# Patient Record
Sex: Female | Born: 1990 | Race: White | Hispanic: Yes | Marital: Single | Smoking: Never smoker
Health system: Southern US, Community
[De-identification: ages and names within clinical notes are randomized; demographics above are authoritative.]

## PROBLEM LIST (undated history)

## (undated) ENCOUNTER — Inpatient Hospital Stay (HOSPITAL_COMMUNITY): Payer: Self-pay

## (undated) DIAGNOSIS — D649 Anemia, unspecified: Secondary | ICD-10-CM

## (undated) DIAGNOSIS — Z789 Other specified health status: Secondary | ICD-10-CM

## (undated) DIAGNOSIS — K219 Gastro-esophageal reflux disease without esophagitis: Secondary | ICD-10-CM

## (undated) DIAGNOSIS — G43909 Migraine, unspecified, not intractable, without status migrainosus: Secondary | ICD-10-CM

## (undated) DIAGNOSIS — R519 Headache, unspecified: Secondary | ICD-10-CM

## (undated) DIAGNOSIS — R42 Dizziness and giddiness: Secondary | ICD-10-CM

## (undated) HISTORY — PX: NO PAST SURGERIES: SHX2092

## (undated) HISTORY — DX: Gastro-esophageal reflux disease without esophagitis: K21.9

## (undated) HISTORY — DX: Headache, unspecified: R51.9

## (undated) HISTORY — DX: Anemia, unspecified: D64.9

## (undated) HISTORY — DX: Migraine, unspecified, not intractable, without status migrainosus: G43.909

## (undated) HISTORY — DX: Dizziness and giddiness: R42

## (undated) HISTORY — DX: Other specified health status: Z78.9

---
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
## 2011-08-26 NOTE — L&D Delivery Note (Signed)
Delivery Note At 10:48 AM a viable female was delivered via  (Presentation: ROA).  APGAR: 8, 9; weight .   Placenta status: delivered intact spontaneously.  Cord: 3 vessels with the following complications: None.    Anesthesia:  none Episiotomy: none Lacerations: none Est. Blood Loss (mL): 300 mL  Mom to postpartum.  Baby to nursery-stable.  STINSON, JACOB JEHIEL 03/22/2012, 10:59 AM

## 2011-12-17 ENCOUNTER — Ambulatory Visit (INDEPENDENT_AMBULATORY_CARE_PROVIDER_SITE_OTHER): Payer: Self-pay | Admitting: Family Medicine

## 2011-12-17 VITALS — BP 111/62 | Temp 97.1°F | Ht 60.5 in | Wt 145.1 lb

## 2011-12-17 DIAGNOSIS — Z3400 Encounter for supervision of normal first pregnancy, unspecified trimester: Secondary | ICD-10-CM

## 2011-12-17 DIAGNOSIS — O0930 Supervision of pregnancy with insufficient antenatal care, unspecified trimester: Secondary | ICD-10-CM | POA: Insufficient documentation

## 2011-12-17 LAB — POCT URINALYSIS DIP (DEVICE)
Glucose, UA: NEGATIVE mg/dL
Nitrite: NEGATIVE
Urobilinogen, UA: 0.2 mg/dL (ref 0.0–1.0)

## 2011-12-17 NOTE — Patient Instructions (Addendum)
It was great meeting you today! We are going to check your prenatal labs today as well as schedule you for an ultrasound.   For the belly tightening, it is most likely due to what are called: "Deberah Pelton". If you start feeling stronger contractions that don't go away with rest, if they happen more frequently or more intensely, if you can't talk through them, come to the Maternal Admissions Unit (MAU) here at [facility]. If you feel decreased baby movement, any bleeding from below or loss of fluid, please come to the MAU.   Pregnancy - Second Trimester The second trimester of pregnancy (3 to 6 months) is a period of rapid growth for you and your baby. At the end of the sixth month, your baby is about 9 inches long and weighs 1 1/2 pounds. You will begin to feel the baby move between 18 and 20 weeks of the pregnancy. This is called quickening. Weight gain is faster. A clear fluid (colostrum) may leak out of your breasts. You may feel small contractions of the womb (uterus). This is known as false labor or Braxton-Hicks contractions. This is like a practice for labor when the baby is ready to be born. Usually, the problems with morning sickness have usually passed by the end of your first trimester. Some women develop small dark blotches (called cholasma, mask of pregnancy) on their face that usually goes away after the baby is born. Exposure to the sun makes the blotches worse. Acne may also develop in some pregnant women and pregnant women who have acne, may find that it goes away. PRENATAL EXAMS  Blood work may continue to be done during prenatal exams. These tests are done to check on your health and the probable health of your baby. Blood work is used to follow your blood levels (hemoglobin). Anemia (low hemoglobin) is common during pregnancy. Iron and vitamins are given to help prevent this. You will also be checked for diabetes between 24 and 28 weeks of the pregnancy. Some of the previous  blood tests may be repeated.   The size of the uterus is measured during each visit. This is to make sure that the baby is continuing to grow properly according to the dates of the pregnancy.   Your blood pressure is checked every prenatal visit. This is to make sure you are not getting toxemia.   Your urine is checked to make sure you do not have an infection, diabetes or protein in the urine.   Your weight is checked often to make sure gains are happening at the suggested rate. This is to ensure that both you and your baby are growing normally.   Sometimes, an ultrasound is performed to confirm the proper growth and development of the baby. This is a test which bounces harmless sound waves off the baby so your caregiver can more accurately determine due dates.  Sometimes, a specialized test is done on the amniotic fluid surrounding the baby. This test is called an amniocentesis. The amniotic fluid is obtained by sticking a needle into the belly (abdomen). This is done to check the chromosomes in instances where there is a concern about possible genetic problems with the baby. It is also sometimes done near the end of pregnancy if an early delivery is required. In this case, it is done to help make sure the baby's lungs are mature enough for the baby to live outside of the womb. CHANGES OCCURING IN THE SECOND TRIMESTER OF PREGNANCY Your  body goes through many changes during pregnancy. They vary from person to person. Talk to your caregiver about changes you notice that you are concerned about.  During the second trimester, you will likely have an increase in your appetite. It is normal to have cravings for certain foods. This varies from person to person and pregnancy to pregnancy.   Your lower abdomen will begin to bulge.   You may have to urinate more often because the uterus and baby are pressing on your bladder. It is also common to get more bladder infections during pregnancy (pain with  urination). You can help this by drinking lots of fluids and emptying your bladder before and after intercourse.   You may begin to get stretch marks on your hips, abdomen, and breasts. These are normal changes in the body during pregnancy. There are no exercises or medications to take that prevent this change.   You may begin to develop swollen and bulging veins (varicose veins) in your legs. Wearing support hose, elevating your feet for 15 minutes, 3 to 4 times a day and limiting salt in your diet helps lessen the problem.   Heartburn may develop as the uterus grows and pushes up against the stomach. Antacids recommended by your caregiver helps with this problem. Also, eating smaller meals 4 to 5 times a day helps.   Constipation can be treated with a stool softener or adding bulk to your diet. Drinking lots of fluids, vegetables, fruits, and whole grains are helpful.   Exercising is also helpful. If you have been very active up until your pregnancy, most of these activities can be continued during your pregnancy. If you have been less active, it is helpful to start an exercise program such as walking.   Hemorrhoids (varicose veins in the rectum) may develop at the end of the second trimester. Warm sitz baths and hemorrhoid cream recommended by your caregiver helps hemorrhoid problems.   Backaches may develop during this time of your pregnancy. Avoid heavy lifting, wear low heal shoes and practice good posture to help with backache problems.   Some pregnant women develop tingling and numbness of their hand and fingers because of swelling and tightening of ligaments in the wrist (carpel tunnel syndrome). This goes away after the baby is born.   As your breasts enlarge, you may have to get a bigger bra. Get a comfortable, cotton, support bra. Do not get a nursing bra until the last month of the pregnancy if you will be nursing the baby.   You may get a dark line from your belly button to the  pubic area called the linea nigra.   You may develop rosy cheeks because of increase blood flow to the face.   You may develop spider looking lines of the face, neck, arms and chest. These go away after the baby is born.  HOME CARE INSTRUCTIONS   It is extremely important to avoid all smoking, herbs, alcohol, and unprescribed drugs during your pregnancy. These chemicals affect the formation and growth of the baby. Avoid these chemicals throughout the pregnancy to ensure the delivery of a healthy infant.   Most of your home care instructions are the same as suggested for the first trimester of your pregnancy. Keep your caregiver's appointments. Follow your caregiver's instructions regarding medication use, exercise and diet.   During pregnancy, you are providing food for you and your baby. Continue to eat regular, well-balanced meals. Choose foods such as meat, fish, milk and other  low fat dairy products, vegetables, fruits, and whole-grain breads and cereals. Your caregiver will tell you of the ideal weight gain.   A physical sexual relationship may be continued up until near the end of pregnancy if there are no other problems. Problems could include early (premature) leaking of amniotic fluid from the membranes, vaginal bleeding, abdominal pain, or other medical or pregnancy problems.   Exercise regularly if there are no restrictions. Check with your caregiver if you are unsure of the safety of some of your exercises. The greatest weight gain will occur in the last 2 trimesters of pregnancy. Exercise will help you:   Control your weight.   Get you in shape for labor and delivery.   Lose weight after you have the baby.   Wear a good support or jogging bra for breast tenderness during pregnancy. This may help if worn during sleep. Pads or tissues may be used in the bra if you are leaking colostrum.   Do not use hot tubs, steam rooms or saunas throughout the pregnancy.   Wear your seat belt  at all times when driving. This protects you and your baby if you are in an accident.   Avoid raw meat, uncooked cheese, cat litter boxes and soil used by cats. These carry germs that can cause birth defects in the baby.   The second trimester is also a good time to visit your dentist for your dental health if this has not been done yet. Getting your teeth cleaned is OK. Use a soft toothbrush. Brush gently during pregnancy.   It is easier to loose urine during pregnancy. Tightening up and strengthening the pelvic muscles will help with this problem. Practice stopping your urination while you are going to the bathroom. These are the same muscles you need to strengthen. It is also the muscles you would use as if you were trying to stop from passing gas. You can practice tightening these muscles up 10 times a set and repeating this about 3 times per day. Once you know what muscles to tighten up, do not perform these exercises during urination. It is more likely to contribute to an infection by backing up the urine.   Ask for help if you have financial, counseling or nutritional needs during pregnancy. Your caregiver will be able to offer counseling for these needs as well as refer you for other special needs.   Your skin may become oily. If so, wash your face with mild soap, use non-greasy moisturizer and oil or cream based makeup.  MEDICATIONS AND DRUG USE IN PREGNANCY  Take prenatal vitamins as directed. The vitamin should contain 1 milligram of folic acid. Keep all vitamins out of reach of children. Only a couple vitamins or tablets containing iron may be fatal to a baby or young child when ingested.   Avoid use of all medications, including herbs, over-the-counter medications, not prescribed or suggested by your caregiver. Only take over-the-counter or prescription medicines for pain, discomfort, or fever as directed by your caregiver. Do not use aspirin.   Let your caregiver also know about herbs  you may be using.   Alcohol is related to a number of birth defects. This includes fetal alcohol syndrome. All alcohol, in any form, should be avoided completely. Smoking will cause low birth rate and premature babies.   Street or illegal drugs are very harmful to the baby. They are absolutely forbidden. A baby born to an addicted mother will be addicted at birth. The  baby will go through the same withdrawal an adult does.  SEEK MEDICAL CARE IF:  You have any concerns or worries during your pregnancy. It is better to call with your questions if you feel they cannot wait, rather than worry about them. SEEK IMMEDIATE MEDICAL CARE IF:   An unexplained oral temperature above 102 F (38.9 C) develops, or as your caregiver suggests.   You have leaking of fluid from the vagina (birth canal). If leaking membranes are suspected, take your temperature and tell your caregiver of this when you call.   There is vaginal spotting, bleeding, or passing clots. Tell your caregiver of the amount and how many pads are used. Light spotting in pregnancy is common, especially following intercourse.   You develop a bad smelling vaginal discharge with a change in the color from clear to white.   You continue to feel sick to your stomach (nauseated) and have no relief from remedies suggested. You vomit blood or coffee ground-like materials.   You lose more than 2 pounds of weight or gain more than 2 pounds of weight over 1 week, or as suggested by your caregiver.   You notice swelling of your face, hands, feet, or legs.   You get exposed to [place] measles and have never had them.   You are exposed to fifth disease or chickenpox.   You develop belly (abdominal) pain. Round ligament discomfort is a common non-cancerous (benign) cause of abdominal pain in pregnancy. Your caregiver still must evaluate you.   You develop a bad headache that does not go away.   You develop fever, diarrhea, pain with urination, or  shortness of breath.   You develop visual problems, blurry, or double vision.   You fall or are in a car accident or any kind of trauma.   There is mental or physical violence at home.  Document Released: 08/05/2001 Document Revised: 07/31/2011 Document Reviewed: 02/07/2009 [facility] Patient Information 2012 [place], [place].  Fetal Movement Counts Patient Name: __________________________________________________ Patient Due Date: ____________________ Melody Haver counts is highly recommended in high risk pregnancies, but it is a good idea for every pregnant woman to do. Start counting fetal movements at 28 weeks of the pregnancy. Fetal movements increase after eating a full meal or eating or drinking something sweet (the blood sugar is higher). It is also important to drink plenty of fluids (well hydrated) before doing the count. Lie on your left side because it helps with the circulation or you can sit in a comfortable chair with your arms over your belly (abdomen) with no distractions around you. DOING THE COUNT  Try to do the count the same time of day each time you do it.   Mark the day and time, then see how long it takes for you to feel 10 movements (kicks, flutters, swishes, rolls). You should have at least 10 movements within 2 hours. You will most likely feel 10 movements in much less than 2 hours. If you do not, wait an hour and count again. After a couple of days you will see a pattern.   What you are looking for is a change in the pattern or not enough counts in 2 hours. Is it taking longer in time to reach 10 movements?  SEEK MEDICAL CARE IF:  You feel less than 10 counts in 2 hours. Tried twice.   No movement in one hour.   The pattern is changing or taking longer each day to reach 10 counts in 2 hours.  You feel the baby is not moving as it usually does.  Document Released: 09/10/2006 Document Revised: 07/31/2011 Document Reviewed: 03/13/2009 [facility] Patient Information 2012  [place], [place].

## 2011-12-17 NOTE — Progress Notes (Signed)
Discussed patient with resident.  Chart review done.  Agree with resident note.

## 2011-12-17 NOTE — Progress Notes (Signed)
Subjective:    Cristina Burke is being seen today for her first obstetrical visit. She is at [redacted]w[redacted]d gestation. Her obstetrical history is significant for late prenatal care. Relationship with FOB: significant other, living together. Patient does not intend to breast feed. Patient feeling short of breath with sitting, better with standing and walking. No chest pain. No history of asthma. No cough. No lower extremity swelling. This has lasted for 2 months now.  She also has been feeling her belly tighten in the morning and afternoon. It doesn't last very long and doesn't occur with increased frequency. She is able to talk through it and rest helps. She denies any loss of fluid, discharge, vaginal bleeding and is feeling the baby move.   Pregnancy history fully reviewed.  Menstrual History: OB History    Grav Para Term Preterm Abortions TAB SAB Ect Mult Living   2 1 1       1       Patient's last menstrual period was 06/21/2011.    Review of Systems Pertinent items are noted in HPI.    Objective:    BP 111/62  Temp 97.1 F (36.2 C)  Ht 5' 0.5" (1.537 m)  Wt 145 lb 1.6 oz (65.817 kg)  BMI 27.87 kg/m2  LMP 06/21/2011 General appearance: alert, cooperative and appears stated age Throat: lips, mucosa, and tongue normal; teeth and gums normal Lungs: clear to auscultation bilaterally and no wheezing Heart: regular rate and rhythm, S1, S2 normal, no murmur, click, rub or gallop Abdomen: gravid Pelvic exam: normal external genitalia, vulva, vagina and cervix. Minimal discharge.  Cervical exam: closed, thick and long Extremities: extremities normal, atraumatic, no cyanosis or edema    Assessment:   21 yo G2P1001 at 25.4wga who presents for her initial OB visit  Plan:     - Initial labs drawn. - anatomy US scheduled - 1hr glucola at next visit - Braxton Hicks: belly tightening she is feeling most likely due to [employer]. Gave patient information about it and went over preterm  labor red flags - Shortness of breath: most likely from gravid uterus putting pressure on lungs in sitting position. Went over red flags: chest pain, fever, increased difficulty breathing, significant, asymmetric lower extremity swelling.  - plans on bottle feeding, doesn't know what mode of birth control she is going to use yet. Has used depo in the past with some side effects.  - gave list of pediatricians in town as she wanted more information about that.  - continue Prenatal vitamins. - Genetic Testing: too late to obtain - follow up in 4 weeks.

## 2011-12-17 NOTE — Progress Notes (Signed)
P=111 , patient here to start prenatal care today. Has had positive pregnancy test at [facility]. C/o trace edema in feet, c/o pelvic pain sometimes, Discussed appropriate weight gain, given pregnancy booklet.C/o sometimes when sitting at home or in care feels short of breath and like heart is racing at times. Oxygen saturation today 98%. -99%. Patient declines flu shot.

## 2011-12-17 NOTE — Progress Notes (Signed)
Ob US scheduled on 12/19/11 @ 1030

## 2011-12-18 LAB — GC/CHLAMYDIA PROBE AMP, GENITAL: Chlamydia, DNA Probe: NEGATIVE

## 2011-12-18 LAB — OBSTETRIC PANEL
Basophils Absolute: 0 10*3/uL (ref 0.0–0.1)
Eosinophils Absolute: 0.1 10*3/uL (ref 0.0–0.7)
Hepatitis B Surface Ag: NEGATIVE
Lymphocytes Relative: 22 % (ref 12–46)
Lymphs Abs: 1.6 10*3/uL (ref 0.7–4.0)
MCH: 28.6 pg (ref 26.0–34.0)
Neutrophils Relative %: 67 % (ref 43–77)
Platelets: 222 10*3/uL (ref 150–400)
RBC: 3.78 MIL/uL — ABNORMAL LOW (ref 3.87–5.11)
RDW: 13.7 % (ref 11.5–15.5)
WBC: 7.4 10*3/uL (ref 4.0–10.5)

## 2011-12-19 ENCOUNTER — Encounter: Payer: Self-pay | Admitting: *Deleted

## 2011-12-19 ENCOUNTER — Ambulatory Visit (HOSPITAL_COMMUNITY)
Admission: RE | Admit: 2011-12-19 | Discharge: 2011-12-19 | Disposition: A | Discharge status: Home / Self Care | Payer: Medicaid Other | Source: Ambulatory Visit | Attending: Obstetrics and Gynecology | Admitting: Obstetrics and Gynecology

## 2011-12-19 DIAGNOSIS — Z3400 Encounter for supervision of normal first pregnancy, unspecified trimester: Secondary | ICD-10-CM

## 2011-12-19 DIAGNOSIS — Z1389 Encounter for screening for other disorder: Secondary | ICD-10-CM | POA: Insufficient documentation

## 2011-12-19 DIAGNOSIS — O358XX0 Maternal care for other (suspected) fetal abnormality and damage, not applicable or unspecified: Secondary | ICD-10-CM | POA: Insufficient documentation

## 2011-12-19 DIAGNOSIS — Z363 Encounter for antenatal screening for malformations: Secondary | ICD-10-CM | POA: Insufficient documentation

## 2012-01-02 ENCOUNTER — Encounter (HOSPITAL_COMMUNITY): Payer: Self-pay | Admitting: *Deleted

## 2012-01-02 ENCOUNTER — Inpatient Hospital Stay (HOSPITAL_COMMUNITY)
Admission: AD | Admit: 2012-01-02 | Discharge: 2012-01-02 | Disposition: A | Discharge status: Home / Self Care | Payer: Medicaid Other | Source: Ambulatory Visit | Attending: Obstetrics and Gynecology | Admitting: Obstetrics and Gynecology

## 2012-01-02 DIAGNOSIS — O368190 Decreased fetal movements, unspecified trimester, not applicable or unspecified: Secondary | ICD-10-CM

## 2012-01-02 LAB — URINALYSIS, ROUTINE W REFLEX MICROSCOPIC
Bilirubin Urine: NEGATIVE
Ketones, ur: NEGATIVE mg/dL
Nitrite: NEGATIVE
Protein, ur: NEGATIVE mg/dL
Urobilinogen, UA: 0.2 mg/dL (ref 0.0–1.0)

## 2012-01-02 NOTE — Discharge Instructions (Signed)
Fetal Movement Counts Patient Name: __________________________________________________ Patient Due Date: ____________________ Kick counts is highly recommended in high risk pregnancies, but it is a good idea for every pregnant woman to do. Start counting fetal movements at 28 weeks of the pregnancy. Fetal movements increase after eating a full meal or eating or drinking something sweet (the blood sugar is higher). It is also important to drink plenty of fluids (well hydrated) before doing the count. Lie on your left side because it helps with the circulation or you can sit in a comfortable chair with your arms over your belly (abdomen) with no distractions around you. DOING THE COUNT  Try to do the count the same time of day each time you do it.   Mark the day and time, then see how long it takes for you to feel 10 movements (kicks, flutters, swishes, rolls). You should have at least 10 movements within 2 hours. You will most likely feel 10 movements in much less than 2 hours. If you do not, wait an hour and count again. After a couple of days you will see a pattern.   What you are looking for is a change in the pattern or not enough counts in 2 hours. Is it taking longer in time to reach 10 movements?  SEEK MEDICAL CARE IF:  You feel less than 10 counts in 2 hours. Tried twice.   No movement in one hour.   The pattern is changing or taking longer each day to reach 10 counts in 2 hours.   You feel the baby is not moving as it usually does.  Date: ____________ Movements: ____________ Start time: ____________ Finish time: ____________  Date: ____________ Movements: ____________ Start time: ____________ Finish time: ____________ Date: ____________ Movements: ____________ Start time: ____________ Finish time: ____________ Date: ____________ Movements: ____________ Start time: ____________ Finish time: ____________ Date: ____________ Movements: ____________ Start time: ____________ Finish time:  ____________ Date: ____________ Movements: ____________ Start time: ____________ Finish time: ____________ Date: ____________ Movements: ____________ Start time: ____________ Finish time: ____________ Date: ____________ Movements: ____________ Start time: ____________ Finish time: ____________  Date: ____________ Movements: ____________ Start time: ____________ Finish time: ____________ Date: ____________ Movements: ____________ Start time: ____________ Finish time: ____________ Date: ____________ Movements: ____________ Start time: ____________ Finish time: ____________ Date: ____________ Movements: ____________ Start time: ____________ Finish time: ____________ Date: ____________ Movements: ____________ Start time: ____________ Finish time: ____________ Date: ____________ Movements: ____________ Start time: ____________ Finish time: ____________ Date: ____________ Movements: ____________ Start time: ____________ Finish time: ____________  Date: ____________ Movements: ____________ Start time: ____________ Finish time: ____________ Date: ____________ Movements: ____________ Start time: ____________ Finish time: ____________ Date: ____________ Movements: ____________ Start time: ____________ Finish time: ____________ Date: ____________ Movements: ____________ Start time: ____________ Finish time: ____________ Date: ____________ Movements: ____________ Start time: ____________ Finish time: ____________ Date: ____________ Movements: ____________ Start time: ____________ Finish time: ____________ Date: ____________ Movements: ____________ Start time: ____________ Finish time: ____________  Date: ____________ Movements: ____________ Start time: ____________ Finish time: ____________ Date: ____________ Movements: ____________ Start time: ____________ Finish time: ____________ Date: ____________ Movements: ____________ Start time: ____________ Finish time: ____________ Date: ____________ Movements:  ____________ Start time: ____________ Finish time: ____________ Date: ____________ Movements: ____________ Start time: ____________ Finish time: ____________ Date: ____________ Movements: ____________ Start time: ____________ Finish time: ____________ Date: ____________ Movements: ____________ Start time: ____________ Finish time: ____________  Date: ____________ Movements: ____________ Start time: ____________ Finish time: ____________ Date: ____________ Movements: ____________ Start time: ____________ Finish time: ____________ Date: ____________ Movements: ____________ Start time:   ____________ Finish time: ____________ Date: ____________ Movements: ____________ Start time: ____________ Finish time: ____________ Date: ____________ Movements: ____________ Start time: ____________ Finish time: ____________ Date: ____________ Movements: ____________ Start time: ____________ Finish time: ____________ Date: ____________ Movements: ____________ Start time: ____________ Finish time: ____________  Date: ____________ Movements: ____________ Start time: ____________ Finish time: ____________ Date: ____________ Movements: ____________ Start time: ____________ Finish time: ____________ Date: ____________ Movements: ____________ Start time: ____________ Finish time: ____________ Date: ____________ Movements: ____________ Start time: ____________ Finish time: ____________ Date: ____________ Movements: ____________ Start time: ____________ Finish time: ____________ Date: ____________ Movements: ____________ Start time: ____________ Finish time: ____________ Date: ____________ Movements: ____________ Start time: ____________ Finish time: ____________  Date: ____________ Movements: ____________ Start time: ____________ Finish time: ____________ Date: ____________ Movements: ____________ Start time: ____________ Finish time: ____________ Date: ____________ Movements: ____________ Start time: ____________ Finish  time: ____________ Date: ____________ Movements: ____________ Start time: ____________ Finish time: ____________ Date: ____________ Movements: ____________ Start time: ____________ Finish time: ____________ Date: ____________ Movements: ____________ Start time: ____________ Finish time: ____________ Date: ____________ Movements: ____________ Start time: ____________ Finish time: ____________  Date: ____________ Movements: ____________ Start time: ____________ Finish time: ____________ Date: ____________ Movements: ____________ Start time: ____________ Finish time: ____________ Date: ____________ Movements: ____________ Start time: ____________ Finish time: ____________ Date: ____________ Movements: ____________ Start time: ____________ Finish time: ____________ Date: ____________ Movements: ____________ Start time: ____________ Finish time: ____________ Date: ____________ Movements: ____________ Start time: ____________ Finish time: ____________ Document Released: 09/10/2006 Document Revised: 07/31/2011 Document Reviewed: 03/13/2009 [employer] Patient Information 2012 [employer]. 

## 2012-01-02 NOTE — MAU Note (Signed)
Pt states she hasn't felt the baby move since 2300 last night.  Placing pt on monitor, baby movements heard.  Pt given Kick count button to track movements.

## 2012-01-02 NOTE — MAU Provider Note (Signed)
Chief Complaint:  Decreased Fetal Movement    First Provider Initiated Contact with Patient 01/02/12 1220      Cristina Burke is  21 y.o. G2P1001.  Patient's last menstrual period was 06/21/2011..  She presents complaining of Decreased Fetal Movement . Onset is described as ongoing and has been present for  12 hours.   Obstetrical/Gynecological History: OB History    Grav Para Term Preterm Abortions TAB SAB Ect Mult Living   2 1 1       1       Past Medical History: Past Medical History  Diagnosis Date  . No pertinent past medical history     Past Surgical History: Past Surgical History  Procedure Date  . No past surgeries     Family History: Family History  Problem Relation Age of Onset  . Hypertension Mother   . Diabetes Brother     Social History: History  Substance Use Topics  . Smoking status: Never Smoker   . Smokeless tobacco: Never Used  . Alcohol Use: No    Allergies: No Known Allergies  Prescriptions prior to admission  Medication Sig Dispense Refill  . PRENATAL VITAMINS PO Take 1 tablet by mouth daily.        Review of Systems - Negative except what has been reviewed in HPI History obtained from the patient Respiratory ROS: no cough, shortness of breath, or wheezing Cardiovascular ROS: no chest pain or dyspnea on exertion Gastrointestinal ROS: no abdominal pain, change in bowel habits, or black or bloody stools  Physical Exam   Blood pressure 116/63, pulse 102, temperature 98 F (36.7 C), temperature source Oral, resp. rate 18, height 5' 0.25" (1.53 m), weight 145 lb 3.2 oz (65.862 kg), last menstrual period 06/21/2011, SpO2 100.00%, unknown if currently breastfeeding.  General: General appearance - alert, well appearing, and in no distress, oriented to person, place, and time and overweight Abdomen: Gravid, non tender, fetal movement palpated with exam FHR: 145, mod variability, + accels, Cat I tracing Toco: no  contractions  Labs: Recent Results (from the past 24 hour(s))  URINALYSIS, ROUTINE W REFLEX MICROSCOPIC   Collection Time   01/02/12 11:50 AM      Component Value Range   Color, Urine YELLOW  YELLOW    APPearance CLEAR  CLEAR    Specific Gravity, Urine 1.020  1.005 - 1.030    pH 7.5  5.0 - 8.0    Glucose, UA NEGATIVE  NEGATIVE (mg/dL)   Hgb urine dipstick NEGATIVE  NEGATIVE    Bilirubin Urine NEGATIVE  NEGATIVE    Ketones, ur NEGATIVE  NEGATIVE (mg/dL)   Protein, ur NEGATIVE  NEGATIVE (mg/dL)   Urobilinogen, UA 0.2  0.0 - 1.0 (mg/dL)   Nitrite NEGATIVE  NEGATIVE    Leukocytes, UA NEGATIVE  NEGATIVE    ED Course: Pt reports good fetal movement since being in MAU  Assessment: 1. Decreased fetal movement in pregnancy      Plan: Discharge home Kick Counts reviewed   Krishawna Stiefel E. 01/02/2012,12:27 PM

## 2012-01-05 NOTE — MAU Provider Note (Signed)
Agree with above note.  Cristina Burke 01/05/2012 11:56 AM   

## 2012-01-14 ENCOUNTER — Ambulatory Visit (INDEPENDENT_AMBULATORY_CARE_PROVIDER_SITE_OTHER): Payer: Medicaid Other | Admitting: Advanced Practice Midwife

## 2012-01-14 VITALS — BP 101/70 | Temp 97.5°F | Wt 146.6 lb

## 2012-01-14 DIAGNOSIS — Z3490 Encounter for supervision of normal pregnancy, unspecified, unspecified trimester: Secondary | ICD-10-CM

## 2012-01-14 DIAGNOSIS — O0930 Supervision of pregnancy with insufficient antenatal care, unspecified trimester: Secondary | ICD-10-CM

## 2012-01-14 DIAGNOSIS — O2610 Low weight gain in pregnancy, unspecified trimester: Secondary | ICD-10-CM | POA: Insufficient documentation

## 2012-01-14 DIAGNOSIS — O9989 Other specified diseases and conditions complicating pregnancy, childbirth and the puerperium: Secondary | ICD-10-CM

## 2012-01-14 LAB — POCT URINALYSIS DIP (DEVICE)
Ketones, ur: NEGATIVE mg/dL
Leukocytes, UA: NEGATIVE
Nitrite: NEGATIVE
Protein, ur: NEGATIVE mg/dL
pH: 6.5 (ref 5.0–8.0)

## 2012-01-14 NOTE — Progress Notes (Signed)
I was present for the exam and agree with above.  

## 2012-01-14 NOTE — Progress Notes (Signed)
P=104,  c/o pelvic pressure same as has been having, c/o irregular contractions intermittent , c/o having a cold with head congestion and cough. Discussed appropriate weight gain.

## 2012-01-14 NOTE — Patient Instructions (Signed)

## 2012-01-14 NOTE — Progress Notes (Signed)
Cristina Burke is a 21 y.o. G2P1001 @ [redacted]w[redacted]d here for 4 week f/u appointment.  Reviewed ultrasound results from last visit.  Patient reports about 5 episodes of mild contractions each day.  Continues to experience some pelvic pressure but unchanged.    Discussed appropriate weight gain.  Will obtain growth [place] PRN.  Recent MAU visit 2/2 concern about decreased fetal movement.   1o GTT done today.

## 2012-01-15 ENCOUNTER — Telehealth: Payer: Self-pay | Admitting: *Deleted

## 2012-01-15 DIAGNOSIS — O0930 Supervision of pregnancy with insufficient antenatal care, unspecified trimester: Secondary | ICD-10-CM

## 2012-01-15 NOTE — Telephone Encounter (Signed)
Called [facility] and discussed her 1 hr GTT results may not be accurate , need to repeat . Appointment given.

## 2012-01-20 ENCOUNTER — Other Ambulatory Visit: Payer: Medicaid Other

## 2012-01-20 DIAGNOSIS — Z3490 Encounter for supervision of normal pregnancy, unspecified, unspecified trimester: Secondary | ICD-10-CM

## 2012-01-20 DIAGNOSIS — O2610 Low weight gain in pregnancy, unspecified trimester: Secondary | ICD-10-CM

## 2012-01-21 ENCOUNTER — Encounter: Payer: Self-pay | Admitting: Obstetrics and Gynecology

## 2012-01-21 ENCOUNTER — Telehealth: Payer: Self-pay | Admitting: *Deleted

## 2012-01-21 DIAGNOSIS — Z3490 Encounter for supervision of normal pregnancy, unspecified, unspecified trimester: Secondary | ICD-10-CM

## 2012-01-21 NOTE — Telephone Encounter (Signed)
Pt notified of abnormal 1 hour gtt, she will come in on Friday 01/23/12 for a 3 hour gtt, pt informed to be npo the night before her visit.

## 2012-01-21 NOTE — Telephone Encounter (Signed)
Message copied by Mannie Stabile on Wed Jan 21, 2012  8:54 AM ------      Message from: CONSTANT, PEGGY      Created: Wed Jan 21, 2012  7:42 AM       Please schedule 3 hour GTT for this patient            Cristina Burke

## 2012-01-23 ENCOUNTER — Other Ambulatory Visit: Payer: Medicaid Other

## 2012-01-23 DIAGNOSIS — R7309 Other abnormal glucose: Secondary | ICD-10-CM

## 2012-01-24 LAB — GLUCOSE TOLERANCE, 3 HOURS: Glucose Tolerance, 1 hour: 180 mg/dL (ref 70–189)

## 2012-01-28 ENCOUNTER — Encounter: Payer: Self-pay | Admitting: Family Medicine

## 2012-01-28 ENCOUNTER — Ambulatory Visit (INDEPENDENT_AMBULATORY_CARE_PROVIDER_SITE_OTHER): Payer: Medicaid Other | Admitting: Family Medicine

## 2012-01-28 VITALS — BP 107/65 | Temp 97.3°F | Wt 147.1 lb

## 2012-01-28 DIAGNOSIS — O9989 Other specified diseases and conditions complicating pregnancy, childbirth and the puerperium: Secondary | ICD-10-CM

## 2012-01-28 DIAGNOSIS — Z3490 Encounter for supervision of normal pregnancy, unspecified, unspecified trimester: Secondary | ICD-10-CM

## 2012-01-28 LAB — POCT URINALYSIS DIP (DEVICE)
Bilirubin Urine: NEGATIVE
Glucose, UA: NEGATIVE mg/dL
Hgb urine dipstick: NEGATIVE
Nitrite: NEGATIVE

## 2012-01-28 NOTE — Patient Instructions (Signed)
Breastfeeding BENEFITS OF BREASTFEEDING For the baby  The first milk (colostrum) helps the baby's digestive system function better.   There are antibodies from the mother in the milk that help the baby fight off infections.   The baby has a lower incidence of asthma, allergies, and SIDS (sudden infant death syndrome).   The nutrients in breast milk are better than formulas for the baby and helps the baby's brain grow better.   Babies who breastfeed have less gas, colic, and constipation.  For the mother  Breastfeeding helps develop a very special bond between mother and baby.   It is more convenient, always available at the correct temperature and cheaper than formula feeding.   It burns calories in the mother and helps with losing weight that was gained during pregnancy.   It makes the uterus contract back down to normal size faster and slows bleeding following delivery.   Breastfeeding mothers have a lower risk of developing breast cancer.  NURSE FREQUENTLY  A healthy, full-term baby may breastfeed as often as every hour or space his or her feedings to every 3 hours.   How often to nurse will vary from baby to baby. Watch your baby for signs of hunger, not the clock.   Nurse as often as the baby requests, or when you feel the need to reduce the fullness of your breasts.   Awaken the baby if it has been 3 to 4 hours since the last feeding.   Frequent feeding will help the mother make more milk and will prevent problems like sore nipples and engorgement of the breasts.  BABY'S POSITION AT THE BREAST  Whether lying down or sitting, be sure that the baby's tummy is facing your tummy.   Support the breast with 4 fingers underneath the breast and the thumb above. Make sure your fingers are well away from the nipple and baby's mouth.   Stroke the baby's lips and cheek closest to the breast gently with your finger or nipple.   When the baby's mouth is open wide enough, place all  of your nipple and as much of the dark area around the nipple as possible into your baby's mouth.   Pull the baby in close so the tip of the nose and the baby's cheeks touch the breast during the feeding.  FEEDINGS  The length of each feeding varies from baby to baby and from feeding to feeding.   The baby must suck about 2 to 3 minutes for your milk to get to him or her. This is called a "let down." For this reason, allow the baby to feed on each breast as long as he or she wants. Your baby will end the feeding when he or she has received the right balance of nutrients.   To break the suction, put your finger into the corner of the baby's mouth and slide it between his or her gums before removing your breast from his or her mouth. This will help prevent sore nipples.  REDUCING BREAST ENGORGEMENT  In the first week after your baby is born, you may experience signs of breast engorgement. When breasts are engorged, they feel heavy, warm, full, and may be tender to the touch. You can reduce engorgement if you:   Nurse frequently, every 2 to 3 hours. Mothers who breastfeed early and often have fewer problems with engorgement.   Place light ice packs on your breasts between feedings. This reduces swelling. Wrap the ice packs in a   lightweight towel to protect your skin.   Apply moist hot packs to your breast for 5 to 10 minutes before each feeding. This increases circulation and helps the milk flow.   Gently massage your breast before and during the feeding.   Make sure that the baby empties at least one breast at every feeding before switching sides.   Use a breast pump to empty the breasts if your baby is sleepy or not nursing well. You may also want to pump if you are returning to work or or you feel you are getting engorged.   Avoid bottle feeds, pacifiers or supplemental feedings of water or juice in place of breastfeeding.   Be sure the baby is latched on and positioned properly while  breastfeeding.   Prevent fatigue, stress, and anemia.   Wear a supportive bra, avoiding underwire styles.   Eat a balanced diet with enough fluids.  If you follow these suggestions, your engorgement should improve in 24 to 48 hours. If you are still experiencing difficulty, call your lactation consultant or caregiver. IS MY BABY GETTING ENOUGH MILK? Sometimes, mothers worry about whether their babies are getting enough milk. You can be assured that your baby is getting enough milk if:  The baby is actively sucking and you hear swallowing.   The baby nurses at least 8 to 12 times in a 24 hour time period. Nurse your baby until he or she unlatches or falls asleep at the first breast (at least 10 to 20 minutes), then offer the second side.   The baby is wetting 5 to 6 disposable diapers (6 to 8 cloth diapers) in a 24 hour period by 5 to 6 days of age.   The baby is having at least 2 to 3 stools every 24 hours for the first few months. Breast milk is all the food your baby needs. It is not necessary for your baby to have water or formula. In fact, to help your breasts make more milk, it is best not to give your baby supplemental feedings during the early weeks.   The stool should be soft and yellow.   The baby should gain 4 to 7 ounces per week after he is 4 days old.  TAKE CARE OF YOURSELF Take care of your breasts by:  Bathing or showering daily.   Avoiding the use of soaps on your nipples.   Start feedings on your left breast at one feeding and on your right breast at the next feeding.   You will notice an increase in your milk supply 2 to 5 days after delivery. You may feel some discomfort from engorgement, which makes your breasts very firm and often tender. Engorgement "peaks" out within 24 to 48 hours. In the meantime, apply warm moist towels to your breasts for 5 to 10 minutes before feeding. Gentle massage and expression of some milk before feeding will soften your breasts, making  it easier for your baby to latch on. Wear a well fitting nursing bra and air dry your nipples for 10 to 15 minutes after each feeding.   Only use cotton bra pads.   Only use pure lanolin on your nipples after nursing. You do not need to wash it off before nursing.  Take care of yourself by:   Eating well-balanced meals and nutritious snacks.   Drinking milk, fruit juice, and water to satisfy your thirst (about 8 glasses a day).   Getting plenty of rest.   Increasing calcium in   your diet (1200 mg a day).   Avoiding foods that you notice affect the baby in a bad way.  SEEK MEDICAL CARE IF:   You have any questions or difficulty with breastfeeding.   You need help.   You have a hard, red, sore area on your breast, accompanied by a fever of 100.5 F (38.1 C) or more.   Your baby is too sleepy to eat well or is having trouble sleeping.   Your baby is wetting less than 6 diapers per day, by 5 days of age.   Your baby's skin or white part of his or her eyes is more yellow than it was in the hospital.   You feel depressed.  Document Released: 08/11/2005 Document Revised: 07/31/2011 Document Reviewed: 03/26/2009 [employer] Patient Information 2012 [employer]. 

## 2012-01-28 NOTE — Progress Notes (Signed)
Pulse-101 Pressure- lower abd

## 2012-01-28 NOTE — Progress Notes (Signed)
3hr GTT normal.  No other concerns.  Good fetal activity.  F/u 2 weeks.

## 2012-02-11 ENCOUNTER — Ambulatory Visit (INDEPENDENT_AMBULATORY_CARE_PROVIDER_SITE_OTHER): Payer: Medicaid Other | Admitting: Family Medicine

## 2012-02-11 VITALS — BP 105/70 | Temp 97.1°F | Wt 149.5 lb

## 2012-02-11 DIAGNOSIS — O9989 Other specified diseases and conditions complicating pregnancy, childbirth and the puerperium: Secondary | ICD-10-CM

## 2012-02-11 DIAGNOSIS — O2610 Low weight gain in pregnancy, unspecified trimester: Secondary | ICD-10-CM

## 2012-02-11 LAB — POCT URINALYSIS DIP (DEVICE)
Bilirubin Urine: NEGATIVE
Ketones, ur: NEGATIVE mg/dL
pH: 6.5 (ref 5.0–8.0)

## 2012-02-11 NOTE — Progress Notes (Signed)
21 y.o. G2P1001 @ 33w 4d per LMP who has no concerns. Counseled on breast feeding and pre-term contractions. Plans to BF. Will f/u in 2 weeks.

## 2012-02-11 NOTE — Progress Notes (Signed)
Pulse- 92  Edema- feet  Pain/pressure- "cramping"

## 2012-02-25 ENCOUNTER — Ambulatory Visit (INDEPENDENT_AMBULATORY_CARE_PROVIDER_SITE_OTHER): Payer: Medicaid Other | Admitting: Physician Assistant

## 2012-02-25 VITALS — BP 103/72 | Temp 98.2°F | Wt 149.3 lb

## 2012-02-25 DIAGNOSIS — O9989 Other specified diseases and conditions complicating pregnancy, childbirth and the puerperium: Secondary | ICD-10-CM

## 2012-02-25 DIAGNOSIS — Z3490 Encounter for supervision of normal pregnancy, unspecified, unspecified trimester: Secondary | ICD-10-CM

## 2012-02-25 LAB — POCT URINALYSIS DIP (DEVICE)
Bilirubin Urine: NEGATIVE
Glucose, UA: NEGATIVE mg/dL
Ketones, ur: NEGATIVE mg/dL
Protein, ur: NEGATIVE mg/dL
Specific Gravity, Urine: 1.025 (ref 1.005–1.030)

## 2012-02-25 NOTE — Progress Notes (Signed)
No complaints. +FM daily. GBS/Cultures at next visit.

## 2012-02-25 NOTE — Progress Notes (Signed)
Pulse 95 Patient reports lower pelvic pain/pressure & occasional contractions

## 2012-02-25 NOTE — Patient Instructions (Signed)
Pregnancy - Third Trimester The third trimester of pregnancy (the last 3 months) is a period of the most rapid growth for you and your baby. The baby approaches a length of 20 inches and a weight of 6 to 10 pounds. The baby is adding on fat and getting ready for life outside your body. While inside, babies have periods of sleeping and waking, suck their thumbs, and hiccups. You can often feel small contractions of the uterus. This is false labor. It is also called Braxton-Hicks contractions. This is like a practice for labor. The usual problems in this stage of pregnancy include more difficulty breathing, swelling of the hands and feet from water retention, and having to urinate more often because of the uterus and baby pressing on your bladder.  PRENATAL EXAMS  Blood work may continue to be done during prenatal exams. These tests are done to check on your health and the probable health of your baby. Blood work is used to follow your blood levels (hemoglobin). Anemia (low hemoglobin) is common during pregnancy. Iron and vitamins are given to help prevent this. You may also continue to be checked for diabetes. Some of the past blood tests may be done again.   The size of the uterus is measured during each visit. This makes sure your baby is growing properly according to your pregnancy dates.   Your blood pressure is checked every prenatal visit. This is to make sure you are not getting toxemia.   Your urine is checked every prenatal visit for infection, diabetes and protein.   Your weight is checked at each visit. This is done to make sure gains are happening at the suggested rate and that you and your baby are growing normally.   Sometimes, an ultrasound is performed to confirm the position and the proper growth and development of the baby. This is a test done that bounces harmless sound waves off the baby so your caregiver can more accurately determine due dates.   Discuss the type of pain  medication and anesthesia you will have during your labor and delivery.   Discuss the possibility and anesthesia if a Cesarean Section might be necessary.   Inform your caregiver if there is any mental or physical violence at home.  Sometimes, a specialized non-stress test, contraction stress test and biophysical profile are done to make sure the baby is not having a problem. Checking the amniotic fluid surrounding the baby is called an amniocentesis. The amniotic fluid is removed by sticking a needle into the belly (abdomen). This is sometimes done near the end of pregnancy if an early delivery is required. In this case, it is done to help make sure the baby's lungs are mature enough for the baby to live outside of the womb. If the lungs are not mature and it is unsafe to deliver the baby, an injection of cortisone medication is given to the mother 1 to 2 days before the delivery. This helps the baby's lungs mature and makes it safer to deliver the baby. CHANGES OCCURING IN THE THIRD TRIMESTER OF PREGNANCY Your body goes through many changes during pregnancy. They vary from person to person. Talk to your caregiver about changes you notice and are concerned about.  During the last trimester, you have probably had an increase in your appetite. It is normal to have cravings for certain foods. This varies from person to person and pregnancy to pregnancy.   You may begin to get stretch marks on your hips,   abdomen, and breasts. These are normal changes in the body during pregnancy. There are no exercises or medications to take which prevent this change.   Constipation may be treated with a stool softener or adding bulk to your diet. Drinking lots of fluids, fiber in vegetables, fruits, and whole grains are helpful.   Exercising is also helpful. If you have been very active up until your pregnancy, most of these activities can be continued during your pregnancy. If you have been less active, it is helpful  to start an exercise program such as walking. Consult your caregiver before starting exercise programs.   Avoid all smoking, alcohol, un-prescribed drugs, herbs and "street drugs" during your pregnancy. These chemicals affect the formation and growth of the baby. Avoid chemicals throughout the pregnancy to ensure the delivery of a healthy infant.   Backache, varicose veins and hemorrhoids may develop or get worse.   You will tire more easily in the third trimester, which is normal.   The baby's movements may be stronger and more often.   You may become short of breath easily.   Your belly button may stick out.   A yellow discharge may leak from your breasts called colostrum.   You may have a bloody mucus discharge. This usually occurs a few days to a week before labor begins.  HOME CARE INSTRUCTIONS   Keep your caregiver's appointments. Follow your caregiver's instructions regarding medication use, exercise, and diet.   During pregnancy, you are providing food for you and your baby. Continue to eat regular, well-balanced meals. Choose foods such as meat, fish, milk and other low fat dairy products, vegetables, fruits, and whole-grain breads and cereals. Your caregiver will tell you of the ideal weight gain.   A physical sexual relationship may be continued throughout pregnancy if there are no other problems such as early (premature) leaking of amniotic fluid from the membranes, vaginal bleeding, or belly (abdominal) pain.   Exercise regularly if there are no restrictions. Check with your caregiver if you are unsure of the safety of your exercises. Greater weight gain will occur in the last 2 trimesters of pregnancy. Exercising helps:   Control your weight.   Get you in shape for labor and delivery.   You lose weight after you deliver.   Rest a lot with legs elevated, or as needed for leg cramps or low back pain.   Wear a good support or jogging bra for breast tenderness during  pregnancy. This may help if worn during sleep. Pads or tissues may be used in the bra if you are leaking colostrum.   Do not use hot tubs, steam rooms, or saunas.   Wear your seat belt when driving. This protects you and your baby if you are in an accident.   Avoid raw meat, cat litter boxes and soil used by cats. These carry germs that can cause birth defects in the baby.   It is easier to loose urine during pregnancy. Tightening up and strengthening the pelvic muscles will help with this problem. You can practice stopping your urination while you are going to the bathroom. These are the same muscles you need to strengthen. It is also the muscles you would use if you were trying to stop from passing gas. You can practice tightening these muscles up 10 times a set and repeating this about 3 times per day. Once you know what muscles to tighten up, do not perform these exercises during urination. It is more likely   to cause an infection by backing up the urine.   Ask for help if you have financial, counseling or nutritional needs during pregnancy. Your caregiver will be able to offer counseling for these needs as well as refer you for other special needs.   Make a list of emergency phone numbers and have them available.   Plan on getting help from family or friends when you go home from the hospital.   Make a trial run to the hospital.   Take prenatal classes with the father to understand, practice and ask questions about the labor and delivery.   Prepare the baby's room/nursery.   Do not travel out of the city unless it is absolutely necessary and with the advice of your caregiver.   Wear only low or no heal shoes to have better balance and prevent falling.  MEDICATIONS AND DRUG USE IN PREGNANCY  Take prenatal vitamins as directed. The vitamin should contain 1 milligram of folic acid. Keep all vitamins out of reach of children. Only a couple vitamins or tablets containing iron may be fatal  to a baby or young child when ingested.   Avoid use of all medications, including herbs, over-the-counter medications, not prescribed or suggested by your caregiver. Only take over-the-counter or prescription medicines for pain, discomfort, or fever as directed by your caregiver. Do not use aspirin, ibuprofen (Motrin, Advil, Nuprin) or naproxen (Aleve) unless OK'd by your caregiver.   Let your caregiver also know about herbs you may be using.   Alcohol is related to a number of birth defects. This includes fetal alcohol syndrome. All alcohol, in any form, should be avoided completely. Smoking will cause low birth rate and premature babies.   Street/illegal drugs are very harmful to the baby. They are absolutely forbidden. A baby born to an addicted mother will be addicted at birth. The baby will go through the same withdrawal an adult does.  SEEK MEDICAL CARE IF: You have any concerns or worries during your pregnancy. It is better to call with your questions if you feel they cannot wait, rather than worry about them. DECISIONS ABOUT CIRCUMCISION You may or may not know the sex of your baby. If you know your baby is a boy, it may be time to think about circumcision. Circumcision is the removal of the foreskin of the penis. This is the skin that covers the sensitive end of the penis. There is no proven medical need for this. Often this decision is made on what is popular at the time or based upon religious beliefs and social issues. You can discuss these issues with your caregiver or pediatrician. SEEK IMMEDIATE MEDICAL CARE IF:   An unexplained oral temperature above 102 F (38.9 C) develops, or as your caregiver suggests.   You have leaking of fluid from the vagina (birth canal). If leaking membranes are suspected, take your temperature and tell your caregiver of this when you call.   There is vaginal spotting, bleeding or passing clots. Tell your caregiver of the amount and how many pads are  used.   You develop a bad smelling vaginal discharge with a change in the color from clear to white.   You develop vomiting that lasts more than 24 hours.   You develop chills or fever.   You develop shortness of breath.   You develop burning on urination.   You loose more than 2 pounds of weight or gain more than 2 pounds of weight or as suggested by your   caregiver.   You notice sudden swelling of your face, hands, and feet or legs.   You develop belly (abdominal) pain. Round ligament discomfort is a common non-cancerous (benign) cause of abdominal pain in pregnancy. Your caregiver still must evaluate you.   You develop a severe headache that does not go away.   You develop visual problems, blurred or double vision.   If you have not felt your baby move for more than 1 hour. If you think the baby is not moving as much as usual, eat something with sugar in it and lie down on your left side for an hour. The baby should move at least 4 to 5 times per hour. Call right away if your baby moves less than that.   You fall, are in a car accident or any kind of trauma.   There is mental or physical violence at home.  Document Released: 08/05/2001 Document Revised: 07/31/2011 Document Reviewed: 02/07/2009 [employer] Patient Information 2012 [employer]. 

## 2012-03-03 ENCOUNTER — Ambulatory Visit (INDEPENDENT_AMBULATORY_CARE_PROVIDER_SITE_OTHER): Payer: Medicaid Other | Admitting: Family

## 2012-03-03 VITALS — BP 112/76 | Temp 98.1°F | Wt 151.1 lb

## 2012-03-03 DIAGNOSIS — Z3490 Encounter for supervision of normal pregnancy, unspecified, unspecified trimester: Secondary | ICD-10-CM

## 2012-03-03 DIAGNOSIS — O9989 Other specified diseases and conditions complicating pregnancy, childbirth and the puerperium: Secondary | ICD-10-CM

## 2012-03-03 DIAGNOSIS — O2610 Low weight gain in pregnancy, unspecified trimester: Secondary | ICD-10-CM

## 2012-03-03 LAB — POCT URINALYSIS DIP (DEVICE)
Bilirubin Urine: NEGATIVE
Glucose, UA: NEGATIVE mg/dL
Hgb urine dipstick: NEGATIVE
Ketones, ur: NEGATIVE mg/dL
Specific Gravity, Urine: 1.02 (ref 1.005–1.030)

## 2012-03-03 NOTE — Progress Notes (Signed)
Discussed round ligament pain; no bleeding or LOF; GBS and GC/CT collected

## 2012-03-03 NOTE — Addendum Note (Signed)
Addended by: Melissa Noon on: 03/03/2012 10:46 AM   Modules accepted: Orders

## 2012-03-03 NOTE — Progress Notes (Signed)
Pulse 70 Patient reports occasional contractions and lower abdominal pain/pressure

## 2012-03-04 LAB — GC/CHLAMYDIA PROBE AMP, GENITAL: GC Probe Amp, Genital: NEGATIVE

## 2012-03-05 LAB — OB RESULTS CONSOLE GBS: GBS: POSITIVE

## 2012-03-09 LAB — CULTURE, BETA STREP (GROUP B ONLY)

## 2012-03-10 ENCOUNTER — Ambulatory Visit (INDEPENDENT_AMBULATORY_CARE_PROVIDER_SITE_OTHER): Payer: Medicaid Other | Admitting: Advanced Practice Midwife

## 2012-03-10 VITALS — BP 119/74 | Temp 97.0°F | Wt 150.5 lb

## 2012-03-10 DIAGNOSIS — Z3490 Encounter for supervision of normal pregnancy, unspecified, unspecified trimester: Secondary | ICD-10-CM

## 2012-03-10 DIAGNOSIS — O2610 Low weight gain in pregnancy, unspecified trimester: Secondary | ICD-10-CM

## 2012-03-10 DIAGNOSIS — O9989 Other specified diseases and conditions complicating pregnancy, childbirth and the puerperium: Secondary | ICD-10-CM

## 2012-03-10 LAB — POCT URINALYSIS DIP (DEVICE)
Bilirubin Urine: NEGATIVE
Glucose, UA: NEGATIVE mg/dL
Nitrite: NEGATIVE
Urobilinogen, UA: 0.2 mg/dL (ref 0.0–1.0)

## 2012-03-10 NOTE — Progress Notes (Signed)
Pt reports doing well, no questions or concerns; reviewed GBS results and treatment; No bleeding or LOF.  PTL precautions given.

## 2012-03-10 NOTE — Progress Notes (Signed)
Pulse- 71  Pain/pressure-lower abd  Vaginal discharge- clear Pt c/o "snot discharge that was pink tinged" x 1

## 2012-03-17 ENCOUNTER — Ambulatory Visit (INDEPENDENT_AMBULATORY_CARE_PROVIDER_SITE_OTHER): Payer: Medicaid Other | Admitting: Physician Assistant

## 2012-03-17 VITALS — BP 103/73 | Temp 97.2°F | Wt 152.6 lb

## 2012-03-17 DIAGNOSIS — O9989 Other specified diseases and conditions complicating pregnancy, childbirth and the puerperium: Secondary | ICD-10-CM

## 2012-03-17 DIAGNOSIS — Z3490 Encounter for supervision of normal pregnancy, unspecified, unspecified trimester: Secondary | ICD-10-CM

## 2012-03-17 DIAGNOSIS — O0930 Supervision of pregnancy with insufficient antenatal care, unspecified trimester: Secondary | ICD-10-CM

## 2012-03-17 LAB — POCT URINALYSIS DIP (DEVICE)
Bilirubin Urine: NEGATIVE
Ketones, ur: NEGATIVE mg/dL
Leukocytes, UA: NEGATIVE
Nitrite: NEGATIVE
Protein, ur: NEGATIVE mg/dL

## 2012-03-17 NOTE — Patient Instructions (Signed)

## 2012-03-17 NOTE — Progress Notes (Signed)
No complaints. +FM. EFW 7#8 by [facility]. Desired epidural in labor. Breastfeeding. Nexplanon pp

## 2012-03-17 NOTE — Progress Notes (Signed)
P =90 C/o pain and pressure in the lower pelvic area

## 2012-03-22 ENCOUNTER — Inpatient Hospital Stay (HOSPITAL_COMMUNITY)
Admission: AD | Admit: 2012-03-22 | Discharge: 2012-03-24 | DRG: 775 | Disposition: A | Discharge status: Home / Self Care | Payer: Medicaid Other | Source: Ambulatory Visit | Attending: Family Medicine | Admitting: Family Medicine

## 2012-03-22 ENCOUNTER — Encounter (HOSPITAL_COMMUNITY): Payer: Self-pay | Admitting: *Deleted

## 2012-03-22 DIAGNOSIS — IMO0001 Reserved for inherently not codable concepts without codable children: Secondary | ICD-10-CM

## 2012-03-22 DIAGNOSIS — Z22330 Carrier of Group B streptococcus: Secondary | ICD-10-CM

## 2012-03-22 DIAGNOSIS — O99892 Other specified diseases and conditions complicating childbirth: Principal | ICD-10-CM | POA: Diagnosis present

## 2012-03-22 DIAGNOSIS — O9989 Other specified diseases and conditions complicating pregnancy, childbirth and the puerperium: Secondary | ICD-10-CM

## 2012-03-22 DIAGNOSIS — Z3490 Encounter for supervision of normal pregnancy, unspecified, unspecified trimester: Secondary | ICD-10-CM

## 2012-03-22 LAB — RPR: RPR Ser Ql: NONREACTIVE

## 2012-03-22 LAB — CBC
HCT: 38.8 % (ref 36.0–46.0)
Hemoglobin: 12.6 g/dL (ref 12.0–15.0)
MCH: 27.5 pg (ref 26.0–34.0)
MCHC: 32.5 g/dL (ref 30.0–36.0)
MCV: 84.7 fL (ref 78.0–100.0)
RBC: 4.58 MIL/uL (ref 3.87–5.11)

## 2012-03-22 LAB — TYPE AND SCREEN

## 2012-03-22 LAB — ABO/RH: ABO/RH(D): O POS

## 2012-03-22 MED ORDER — OXYCODONE-ACETAMINOPHEN 5-325 MG PO TABS
1.0000 | ORAL_TABLET | ORAL | Status: DC | PRN
  Administered 2012-03-24: 1 via ORAL
  Filled 2012-03-22: qty 1

## 2012-03-22 MED ORDER — ACETAMINOPHEN 325 MG PO TABS: 650.0000 mg | ORAL_TABLET | ORAL | Status: DC | PRN

## 2012-03-22 MED ORDER — ONDANSETRON HCL 4 MG/2ML IJ SOLN: 4.0000 mg | Freq: Four times a day (QID) | INTRAMUSCULAR | Status: DC | PRN

## 2012-03-22 MED ORDER — SENNOSIDES-DOCUSATE SODIUM 8.6-50 MG PO TABS
2.0000 | ORAL_TABLET | Freq: Every day | ORAL | Status: DC
  Administered 2012-03-22 – 2012-03-23 (×2): 2 via ORAL

## 2012-03-22 MED ORDER — LACTATED RINGERS IV SOLN
INTRAVENOUS | Status: DC
  Administered 2012-03-22: 10:00:00 via INTRAVENOUS

## 2012-03-22 MED ORDER — FLEET ENEMA 7-19 GM/118ML RE ENEM: 1.0000 | ENEMA | RECTAL | Status: DC | PRN

## 2012-03-22 MED ORDER — EPHEDRINE 5 MG/ML INJ: 10.0000 mg | INTRAVENOUS | Status: DC | PRN

## 2012-03-22 MED ORDER — IBUPROFEN 600 MG PO TABS
600.0000 mg | ORAL_TABLET | Freq: Four times a day (QID) | ORAL | Status: DC
  Administered 2012-03-22 – 2012-03-24 (×7): 600 mg via ORAL
  Filled 2012-03-22 (×7): qty 1

## 2012-03-22 MED ORDER — ONDANSETRON HCL 4 MG/2ML IJ SOLN: 4.0000 mg | INTRAMUSCULAR | Status: DC | PRN

## 2012-03-22 MED ORDER — TETANUS-DIPHTH-ACELL PERTUSSIS 5-2.5-18.5 LF-MCG/0.5 IM SUSP
0.5000 mL | Freq: Once | INTRAMUSCULAR | Status: AC
  Administered 2012-03-23: 0.5 mL via INTRAMUSCULAR
  Filled 2012-03-22: qty 0.5

## 2012-03-22 MED ORDER — FENTANYL 2.5 MCG/ML BUPIVACAINE 1/10 % EPIDURAL INFUSION (WH - ANES)
14.0000 mL/h | INTRAMUSCULAR | Status: DC
  Filled 2012-03-22: qty 60

## 2012-03-22 MED ORDER — DIBUCAINE 1 % RE OINT: 1.0000 "application " | TOPICAL_OINTMENT | RECTAL | Status: DC | PRN

## 2012-03-22 MED ORDER — PHENYLEPHRINE 40 MCG/ML (10ML) SYRINGE FOR IV PUSH (FOR BLOOD PRESSURE SUPPORT): 80.0000 ug | PREFILLED_SYRINGE | INTRAVENOUS | Status: DC | PRN

## 2012-03-22 MED ORDER — EPHEDRINE 5 MG/ML INJ
10.0000 mg | INTRAVENOUS | Status: DC | PRN
  Filled 2012-03-22: qty 4

## 2012-03-22 MED ORDER — FENTANYL CITRATE 0.05 MG/ML IJ SOLN
100.0000 ug | INTRAMUSCULAR | Status: DC | PRN
  Filled 2012-03-22: qty 2

## 2012-03-22 MED ORDER — OXYTOCIN 40 UNITS IN LACTATED RINGERS INFUSION - SIMPLE MED
62.5000 mL/h | Freq: Once | INTRAVENOUS | Status: DC
  Filled 2012-03-22: qty 1000

## 2012-03-22 MED ORDER — CITRIC ACID-SODIUM CITRATE 334-500 MG/5ML PO SOLN: 30.0000 mL | ORAL | Status: DC | PRN

## 2012-03-22 MED ORDER — LIDOCAINE HCL (PF) 1 % IJ SOLN
30.0000 mL | INTRAMUSCULAR | Status: DC | PRN
  Filled 2012-03-22: qty 30

## 2012-03-22 MED ORDER — ZOLPIDEM TARTRATE 5 MG PO TABS: 5.0000 mg | ORAL_TABLET | Freq: Every evening | ORAL | Status: DC | PRN

## 2012-03-22 MED ORDER — IBUPROFEN 600 MG PO TABS
600.0000 mg | ORAL_TABLET | Freq: Four times a day (QID) | ORAL | Status: DC | PRN
  Administered 2012-03-22: 600 mg via ORAL
  Filled 2012-03-22: qty 1

## 2012-03-22 MED ORDER — WITCH HAZEL-GLYCERIN EX PADS: 1.0000 "application " | MEDICATED_PAD | CUTANEOUS | Status: DC | PRN

## 2012-03-22 MED ORDER — OXYCODONE-ACETAMINOPHEN 5-325 MG PO TABS
1.0000 | ORAL_TABLET | ORAL | Status: DC | PRN
  Administered 2012-03-22 (×2): 1 via ORAL
  Filled 2012-03-22 (×2): qty 1

## 2012-03-22 MED ORDER — DIPHENHYDRAMINE HCL 25 MG PO CAPS: 25.0000 mg | ORAL_CAPSULE | Freq: Four times a day (QID) | ORAL | Status: DC | PRN

## 2012-03-22 MED ORDER — PRENATAL MULTIVITAMIN CH
1.0000 | ORAL_TABLET | Freq: Every day | ORAL | Status: DC
  Administered 2012-03-23 – 2012-03-24 (×2): 1 via ORAL
  Filled 2012-03-22 (×3): qty 1

## 2012-03-22 MED ORDER — BENZOCAINE-MENTHOL 20-0.5 % EX AERO: 1.0000 "application " | INHALATION_SPRAY | CUTANEOUS | Status: DC | PRN

## 2012-03-22 MED ORDER — OXYTOCIN BOLUS FROM INFUSION
250.0000 mL | Freq: Once | INTRAVENOUS | Status: AC
  Administered 2012-03-22: 250 mL via INTRAVENOUS
  Filled 2012-03-22: qty 500

## 2012-03-22 MED ORDER — LACTATED RINGERS IV SOLN: 500.0000 mL | INTRAVENOUS | Status: DC | PRN

## 2012-03-22 MED ORDER — LACTATED RINGERS IV SOLN
500.0000 mL | Freq: Once | INTRAVENOUS | Status: AC
  Administered 2012-03-22: 500 mL via INTRAVENOUS

## 2012-03-22 MED ORDER — ONDANSETRON HCL 4 MG PO TABS: 4.0000 mg | ORAL_TABLET | ORAL | Status: DC | PRN

## 2012-03-22 MED ORDER — SIMETHICONE 80 MG PO CHEW: 80.0000 mg | CHEWABLE_TABLET | ORAL | Status: DC | PRN

## 2012-03-22 MED ORDER — PHENYLEPHRINE 40 MCG/ML (10ML) SYRINGE FOR IV PUSH (FOR BLOOD PRESSURE SUPPORT)
80.0000 ug | PREFILLED_SYRINGE | INTRAVENOUS | Status: DC | PRN
  Filled 2012-03-22: qty 5

## 2012-03-22 MED ORDER — DIPHENHYDRAMINE HCL 50 MG/ML IJ SOLN: 12.5000 mg | INTRAMUSCULAR | Status: DC | PRN

## 2012-03-22 MED ORDER — PENICILLIN G POTASSIUM 5000000 UNITS IJ SOLR
5.0000 10*6.[IU] | Freq: Once | INTRAVENOUS | Status: AC
  Administered 2012-03-22: 5 10*6.[IU] via INTRAVENOUS
  Filled 2012-03-22: qty 5

## 2012-03-22 MED ORDER — PENICILLIN G POTASSIUM 5000000 UNITS IJ SOLR
2.5000 10*6.[IU] | INTRAVENOUS | Status: DC
  Filled 2012-03-22: qty 2.5

## 2012-03-22 MED ORDER — LANOLIN HYDROUS EX OINT: TOPICAL_OINTMENT | CUTANEOUS | Status: DC | PRN

## 2012-03-22 NOTE — MAU Note (Signed)
Contractions have been regular since 0800, every 3-93min.  Mucous d/c.  Denies problems with preg.

## 2012-03-22 NOTE — H&P (Signed)
Cristina Burke is a 21 y.o. female presenting for SOL. Maternal Medical History:  Reason for admission: Reason for admission: contractions.  Contractions: Onset was 3-5 hours ago.   Frequency: regular.   Duration is approximately 45 seconds.   Perceived severity is strong.    Fetal activity: Perceived fetal activity is normal.    Prenatal Complications - Diabetes: none.    OB History    Grav Para Term Preterm Abortions TAB SAB Ect Mult Living   2 1 1       1      Past Medical History  Diagnosis Date  . No pertinent past medical history    Past Surgical History  Procedure Date  . No past surgeries    Family History: family history includes Diabetes in her brother and Hypertension in her mother. Social History:  reports that she has never smoked. She has never used smokeless tobacco. She reports that she does not drink alcohol or use illicit drugs.   Prenatal Transfer Tool  Maternal Diabetes: No Genetic Screening: Declined Maternal Ultrasounds/Referrals: Normal Fetal Ultrasounds or other Referrals:  None Maternal Substance Abuse:  No Significant Maternal Medications:  None Significant Maternal Lab Results:  None Other Comments:  None  ROS  Dilation: 7 Effacement (%): 90 Station: -2 Exam by:: Renaldo Harrison, RN Blood pressure 139/68, pulse 73, temperature 97.8 F (36.6 C), temperature source Oral, resp. rate 20, height 5' 0.5" (1.537 m), weight 68.947 kg (152 lb), last menstrual period 06/21/2011. Maternal Exam:  Uterine Assessment: Contraction strength is moderate.  Contraction duration is 45 seconds. Contraction frequency is regular.   Abdomen: Fundal height is term.   Estimated fetal weight is 7#.   Fetal presentation: vertex  Pelvis: adequate for delivery.   Cervix: Cervix evaluated by digital exam.     Fetal Exam Fetal Monitor Review: Mode: hand-held doppler probe.   Baseline rate: 130s.  Variability: moderate (6-25 bpm).   Pattern: no accelerations and no  decelerations.    Fetal State Assessment: Category I - tracings are normal.     Physical Exam  Constitutional: She is oriented to person, place, and time. She appears well-developed and well-nourished.  HENT:  Head: Normocephalic and atraumatic.  Cardiovascular: Normal rate, regular rhythm and normal heart sounds.   Respiratory: Effort normal. No respiratory distress. She has no wheezes. She has no rales. She exhibits no tenderness.  GI: Soft. She exhibits no distension and no mass. There is no tenderness. There is no rebound and no guarding.  Neurological: She is alert and oriented to person, place, and time.  Skin: Skin is warm and dry.  Psychiatric: She has a normal mood and affect. Her behavior is normal. Judgment and thought content normal.    Prenatal labs: ABO, Rh: O/POS/-- (04/24 1115) Antibody: NEG (04/24 1115) Rubella: 156.9 (04/24 1115) RPR: NON REAC (04/24 1115)  HBsAg: NEGATIVE (04/24 1115)  HIV: NON REACTIVE (04/24 1115)  GBS: Positive (07/12 0000)   Assessment/Plan: 1.  IUP at 39.2 weeks 2.  Active labor 3.  GBS positive  Admit.  Penicillin.  Epidural when labs return.  Breast and bottle feed.   Embrie Mikkelsen JEHIEL 03/22/2012, 10:16 AM

## 2012-03-23 LAB — CBC
HCT: 31.2 % — ABNORMAL LOW (ref 36.0–46.0)
Hemoglobin: 10.2 g/dL — ABNORMAL LOW (ref 12.0–15.0)
MCHC: 32.7 g/dL (ref 30.0–36.0)
RBC: 3.64 MIL/uL — ABNORMAL LOW (ref 3.87–5.11)

## 2012-03-23 NOTE — Progress Notes (Signed)
UR chart review completed.  

## 2012-03-23 NOTE — Progress Notes (Signed)
Post Partum Day 1 Subjective: no complaints, up ad lib and voiding; breast/bottle; desires nexplanon, uncertain about early discharge, will inform staff  Objective: Blood pressure 107/71, pulse 67, temperature 98.2 F (36.8 C), temperature source Oral, resp. rate 18, height 5' 0.5" (1.537 m), weight 68.947 kg (152 lb), last menstrual period 06/21/2011, SpO2 98.00%, unknown if currently breastfeeding.  Physical Exam:  General: alert, cooperative and appears stated age Lochia: appropriate Uterine Fundus: firm Incision: n/a DVT Evaluation: No evidence of DVT seen on physical exam. Negative Homan's sign.   Basename 03/23/12 0545 03/22/12 0950  HGB 10.2* 12.6  HCT 31.2* 38.8    Assessment/Plan: Plan for discharge tomorrow   LOS: 1 day   [facility] 03/23/2012, 7:24 AM

## 2012-03-24 ENCOUNTER — Encounter: Payer: Self-pay | Admitting: *Deleted

## 2012-03-24 ENCOUNTER — Encounter: Payer: Medicaid Other | Admitting: Family Medicine

## 2012-03-24 MED ORDER — IBUPROFEN 600 MG PO TABS: 600.0000 mg | ORAL_TABLET | Freq: Four times a day (QID) | ORAL | Status: AC

## 2012-03-24 NOTE — Progress Notes (Signed)
Sw referral noted. Sw talked with RN yesterday and today who reports that pt is providing care for infant and showing interest. Therefore, Sw has screened out referral at this time. Sw will see pt if issues arise or by her request.      

## 2012-03-24 NOTE — Discharge Summary (Signed)
Obstetric Discharge Summary Reason for Admission: onset of labor Prenatal Procedures: none Intrapartum Procedures: spontaneous vaginal delivery Postpartum Procedures: none Complications-Operative and Postpartum: none Hemoglobin  Date Value Range Status  03/23/2012 10.2* 12.0 - 15.0 g/dL Final     DELTA CHECK NOTED     REPEATED TO VERIFY     HCT  Date Value Range Status  03/23/2012 31.2* 36.0 - 46.0 % Final    Physical Exam:  General: alert and cooperative Lochia: appropriate Uterine Fundus: firm Incision: n/a DVT Evaluation: No evidence of DVT seen on physical exam. Negative Homan's sign. No cords or calf tenderness. No significant calf/ankle edema.  Discharge Diagnoses: Term Pregnancy-delivered  Discharge Information: Date: 03/24/2012 Activity: unrestricted Diet: routine Medications: PNV and Ibuprofen Condition: stable Instructions: refer to practice specific booklet Discharge to: home Follow-up Information    Follow up with [facility] in 4 weeks.   Contact information:   [address] [place] [address] [phone]         Newborn Data: Live born female  Birth Weight: 8 lb 8 oz (3856 g) APGAR: 8, 9  Home with mother.  Marikay Alar 03/24/2012, 7:43 AM  I have seen and examined this patient and I agree with the above. Cam Hai 8:17 PM 03/24/2012

## 2012-03-25 NOTE — Discharge Summary (Signed)
Attestation of Attending Supervision of Advanced Practitioner (CNM/NP): Evaluation and management procedures were performed by the Advanced Practitioner under my supervision and collaboration.  I have reviewed the Advanced Practitioner's note and chart, and I agree with the management and plan.  HARRAWAY-SMITH, Katina Remick 1:52 PM     

## 2012-04-22 ENCOUNTER — Ambulatory Visit (INDEPENDENT_AMBULATORY_CARE_PROVIDER_SITE_OTHER): Payer: Medicaid Other | Admitting: Physician Assistant

## 2012-04-22 ENCOUNTER — Encounter: Payer: Self-pay | Admitting: Physician Assistant

## 2012-04-22 NOTE — Patient Instructions (Signed)
Contraceptive Implant Information A contraceptive implant is a plastic rod that is inserted under the skin. It is usually inserted under the skin of your upper arm. It continually releases small amounts of progestin (synthetic progesterone) into the bloodstream. This prevents an egg from being released from the ovary. It also thickens the cervical mucus to prevent sperm from entering the cervix, and it thins the uterine lining to prevent a fertilized egg from attaching to the uterus. They can be effective for up to 3 years. Implants do not provide protection against sexually transmitted diseases (STDs).  The procedure to insert an implant usually takes about 10 minutes. There may be minor bruising, swelling, and discomfort at the insertion site for a couple days. The implant begins to work within the first day. Other contraceptive protection should be used for 2 weeks. Follow up with your caregiver to get rechecked as directed. Your caregiver will make sure you are a good candidate for the contraceptive implant. Discuss with your caregiver the possible side effects of the implant ADVANTAGES  It prevents pregnancy for up to 3 years.   It is easily reversible.   It is convenient.   The progestins may protect against uterine and ovarian cancer.   It can be used when breastfeeding.   It can be used by women who cannot take estrogen.  DISADVANTAGES  You may have irregular or unplanned vaginal bleeding.   You may develop side effects, including headache, weight gain, acne, breast tenderness, or mood changes.   You may have tissue or nerve damage after insertion (rare).   It may be difficult and uncomfortable to remove.   Certain medications may interfere with the effectiveness of the implants.  REMOVAL OF IMPLANT The implant should be removed in 3 years or as directed by your caregiver. The implants effect wears off in a few hours after removal. Your ability to get pregnant (fertility) is  restored within a couple of weeks. New implants can be inserted as soon as the old ones are removed if desired. DO NOT GET THE IMPLANT IF:   You are pregnant.   You have a history of breast cancer, osteoporosis, blood clots, heart disease, diabetes, high blood pressure, liver disease, tumors, or stroke.    You have undiagnosed vaginal bleeding.   You have overly sensitive to certain parts of the implant.  Document Released: 07/31/2011 Document Reviewed: 07/29/2011 [employer] Patient Information 2012 [employer]. 

## 2012-04-22 NOTE — Progress Notes (Signed)
  Subjective:     Cristina Burke is a 21 y.o. female who presents for a postpartum visit. She is 4 weeks postpartum following a spontaneous vaginal delivery. I have fully reviewed the prenatal and intrapartum course. The delivery was at 39 gestational weeks. Outcome: spontaneous vaginal delivery. Anesthesia: none. Postpartum course has been uncomplicated. Baby's course has been uncomplicated. Baby is feeding by breast. Bleeding staining only. Bowel function is normal. Bladder function is normal. Patient is not sexually active. Contraception method is abstinence and desires nexplanon. Postpartum depression screening: negative.  The following portions of the patient's history were reviewed and updated as appropriate: allergies, current medications, past family history, past medical history, past social history, past surgical history and problem list.  Review of Systems A comprehensive review of systems was negative.   Objective:    BP 106/70  Pulse 93  Temp 98.7 F (37.1 C) (Oral)  Ht 5' 0.5" (1.537 m)  Wt 128 lb 9.6 oz (58.333 kg)  BMI 24.70 kg/m2  Breastfeeding? Yes  General:  alert, cooperative and no distress   Breasts:  inspection negative, no nipple discharge or bleeding, no masses or nodularity palpable  Abdomen: soft, non-tender; bowel sounds normal; no masses,  no organomegaly        Assessment:    4 postpartum exam. Pap smear not done at today's visit.   Plan:    1. Contraception: plans Nexplanon 2. Needs pap in 6 weeks 3. Follow up in: next available appt for Nexplanon.

## 2012-05-13 ENCOUNTER — Ambulatory Visit: Payer: Medicaid Other | Admitting: Obstetrics & Gynecology

## 2012-06-02 ENCOUNTER — Ambulatory Visit: Payer: Medicaid Other | Admitting: Obstetrics & Gynecology

## 2012-08-05 ENCOUNTER — Inpatient Hospital Stay (HOSPITAL_COMMUNITY)
Admission: AD | Admit: 2012-08-05 | Discharge: 2012-08-05 | Disposition: A | Discharge status: Home / Self Care | Payer: Self-pay | Source: Ambulatory Visit | Attending: Obstetrics & Gynecology | Admitting: Obstetrics & Gynecology

## 2012-08-05 ENCOUNTER — Encounter (HOSPITAL_COMMUNITY): Payer: Self-pay | Admitting: *Deleted

## 2012-08-05 DIAGNOSIS — N644 Mastodynia: Secondary | ICD-10-CM | POA: Insufficient documentation

## 2012-08-05 DIAGNOSIS — O9279 Other disorders of lactation: Secondary | ICD-10-CM | POA: Insufficient documentation

## 2012-08-05 NOTE — MAU Provider Note (Signed)
Chief Complaint: Breast Pain   First Provider Initiated Contact with Patient 08/05/12 1639      SUBJECTIVE HPI: Cristina Burke is a 21 y.o. [id number] who presents with left breast pain. The patient delivered 03/22/12. She has been strictly pumping recently and has not put the baby to breast. She feels as though her milk production has slowed recently, but she is still having breast tenderness regularly. She has not been febrile. She denies any rash, redness or itching of the skin of the breasts.   Past Medical History  Diagnosis Date  . No pertinent past medical history    OB History    Grav Para Term Preterm Abortions TAB SAB Ect Mult Living   2 2 2       2      # Outc Date GA Lbr Len/2nd Wgt Sex Del Anes PTL Lv   1 TRM 5/08 [redacted]w[redacted]d  7lb(3.175kg) M SVD EPI  Yes   Comments: no complications, born in [place], [place] at [facility]   2 [facility] 7/13 [redacted]w[redacted]d 02:34 / 00:14 8lb8oz(3.855kg) M SVD None  Yes     Past Surgical History  Procedure Date  . No past surgeries    History   Social History  . Marital Status: Single    Spouse Name: N/A    Number of Children: N/A  . Years of Education: N/A   Occupational History  . Not on file.   Social History Main Topics  . Smoking status: Never Smoker   . Smokeless tobacco: Never Used  . Alcohol Use: No  . Drug Use: No  . Sexually Active: Yes    Birth Control/ Protection: None   Other Topics Concern  . Not on file   Social History Narrative  . No narrative on file   No current facility-administered medications on file prior to encounter.   Current Outpatient Prescriptions on File Prior to Encounter  Medication Sig Dispense Refill  . Prenatal Vit-Fe Fumarate-FA (PRENATAL MULTIVITAMIN) TABS Take 1 tablet by mouth daily.       No Known Allergies  ROS: Pertinent items in HPI  OBJECTIVE Blood pressure 120/70, pulse 79, temperature 97.5 F (36.4 C), temperature source Oral, resp. rate 16, height 5' (1.524 m), weight 122 lb 8 oz (55.566  kg), not currently breastfeeding. GENERAL: Well-developed, well-nourished female in no acute distress.  HEENT: Normocephalic HEART: normal rate RESP: normal effort BREAST: moderately tender to palpation and firm.  NEURO: Alert and oriented   LAB RESULTS Results for orders placed during the hospital encounter of 08/05/12 (from the past 24 hour(s))  POCT PREGNANCY, URINE     Status: Normal   Collection Time   08/05/12  4:45 PM      Component Value Range   Preg Test, Ur NEGATIVE  NEGATIVE    IMAGING No imaging necessary today  MAU COURSE Lactation consult Patient pumped here using hand pump and was able to fill 3 bottles.  Patient no longer has any breast discomfort.  Lactation gave recommendations for weaning pumping and how to avoid similar soreness in the future.   ASSESSMENT Brest engorgment  PLAN Discharge home Patient given recommendations for pumping regimen and how to wean pumping in the future Patient can follow up in the [facility] clinic as needed or if symptoms worsen     Follow-up Information    Follow up with [facility]. (As needed if symptoms worsen)    Contact information:   [address] [place]  [phone]          Medication List     As of 08/05/2012  5:25 PM    TAKE these medications         prenatal multivitamin Tabs   Take 1 tablet by mouth daily.           Freddi Starr, PA-C 08/05/2012  5:25 PM

## 2012-08-05 NOTE — MAU Note (Signed)
Pt states having pain on left breast, feels like milk ducts are swollen, is breast feeding and pumps x4 months. Put heat on breast last pm and vaporub, and has been massaging breast however feels like milk ducts get harder after massaging them. Pt only pumps, has never put baby to breast. Pumps before and after 4 hour shift at work.

## 2012-08-05 NOTE — Consult Note (Signed)
Mom states her left breast is painful and she has not been able to pump as much volume as usual during her pumping sessions. Mom also has questions about weaning. Both breasts very full, milk leaking out. Provided a hand pump for mom and she started pumping the left breast while doing massage, milk pouring out of breast. Mom stated that she was feeling more comfortable and after pumping 4 ounces mom stated breast is comfortable. Mom denies pain. Written instructions given for mom to continue pumping regularly regardless of the amount she pumps, and to wean gradually.

## 2013-03-04 IMAGING — US US OB DETAIL+14 WK
2 series · 12 of 28 positions shown · non-contrast
Comparison: none

[Series 1: us ob detail +14 wk · 11 of 77 slices shown (1 of 2)]
[im 4/77]
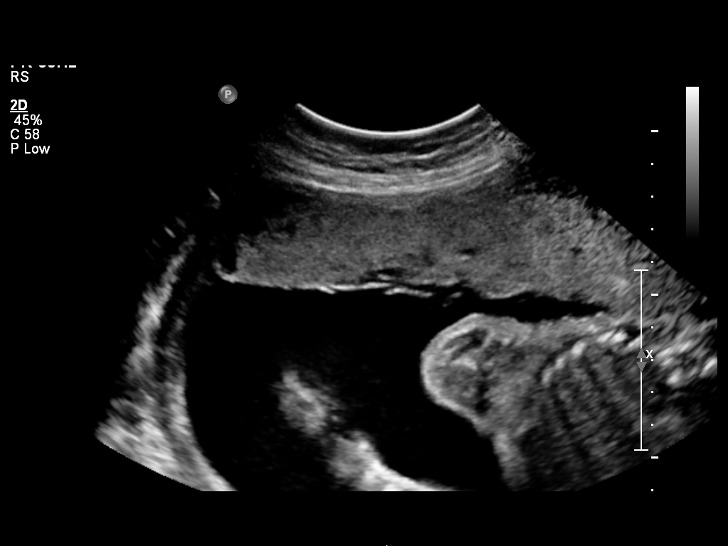
[im 10/77]
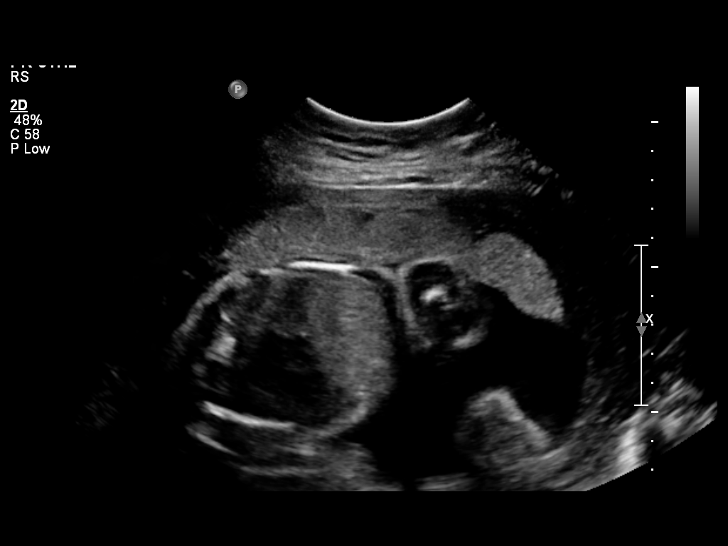
[im 16/77]
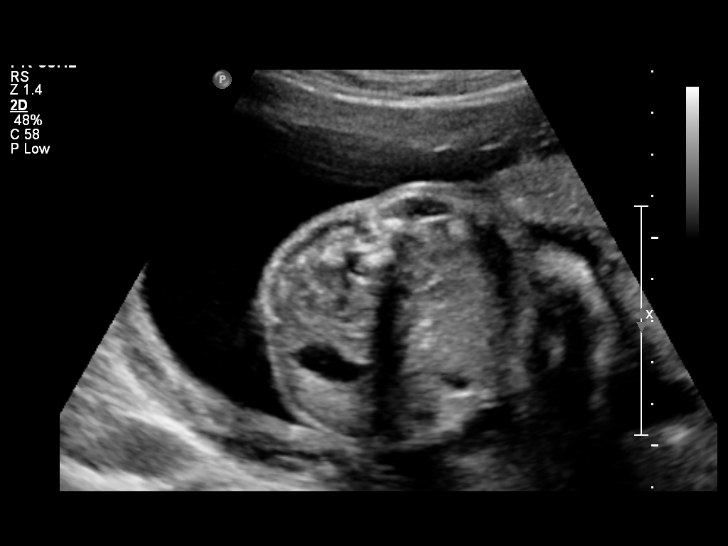
[im 25/77]
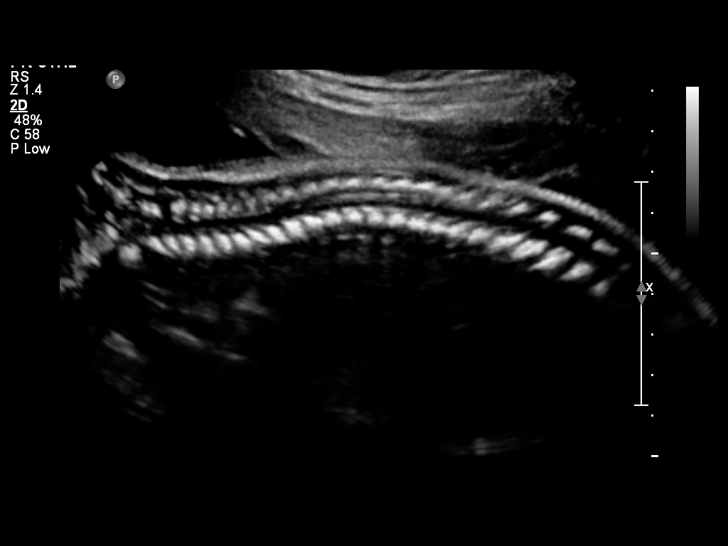
[im 31/77]
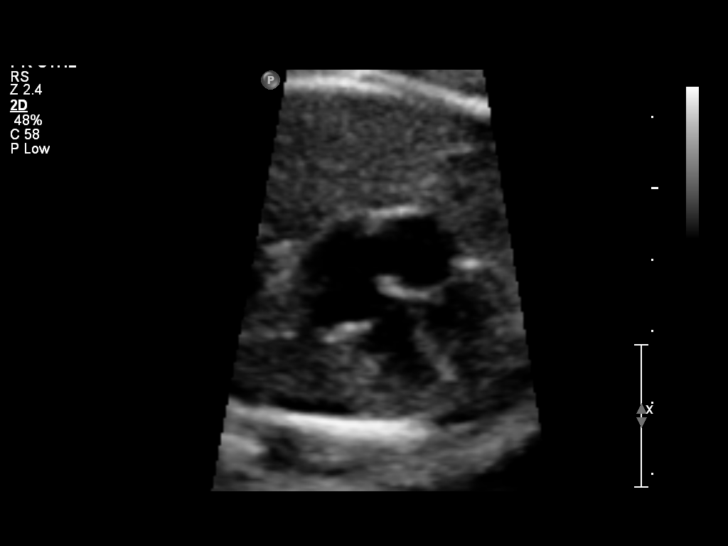
[im 37/77]
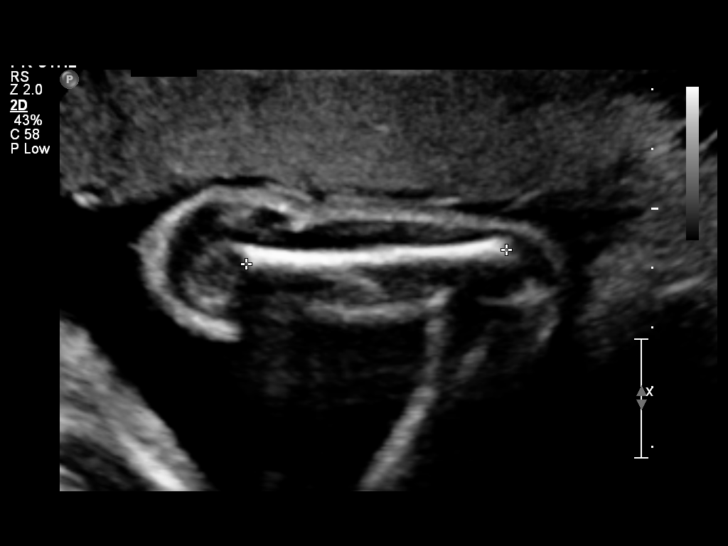
[im 46/77]
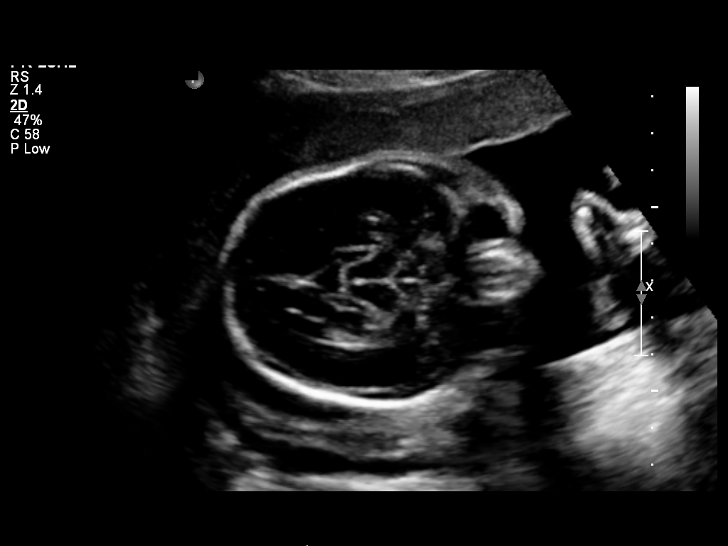
[im 52/77]
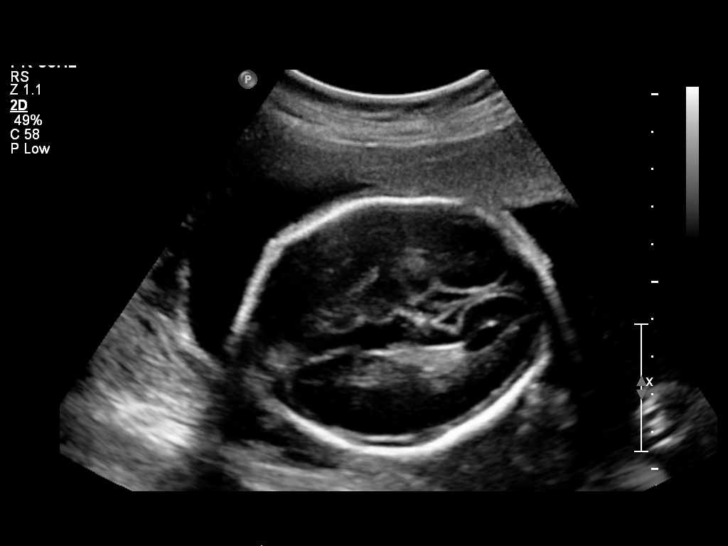
[im 58/77]
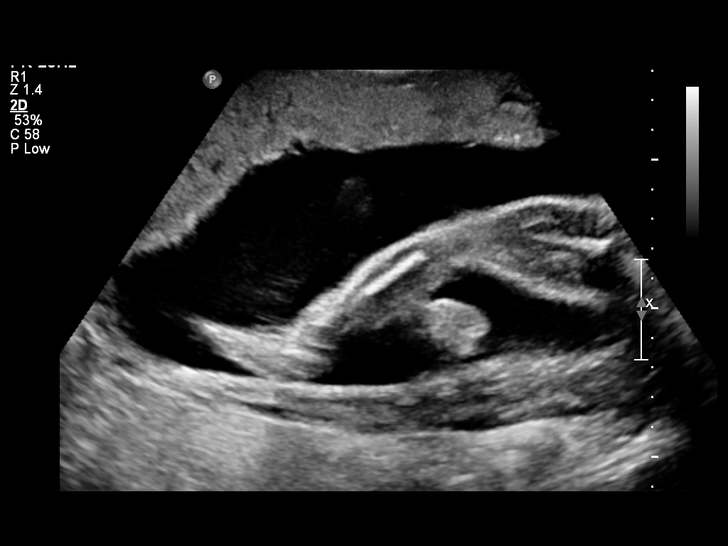
[im 67/77]
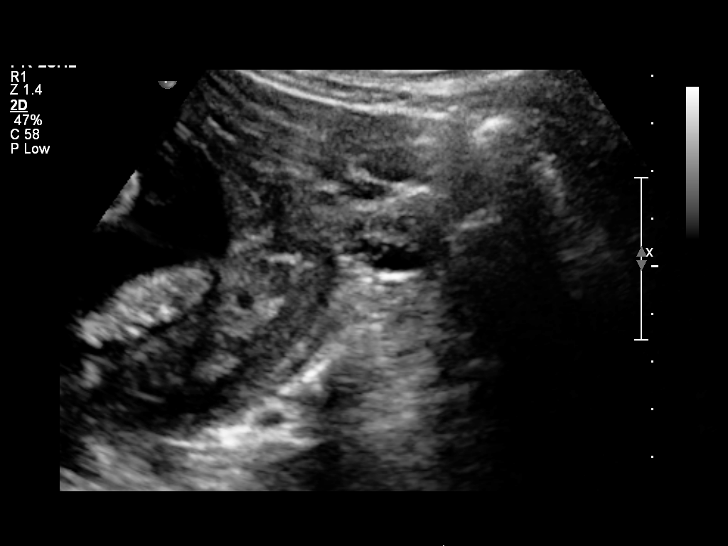
[im 73/77]
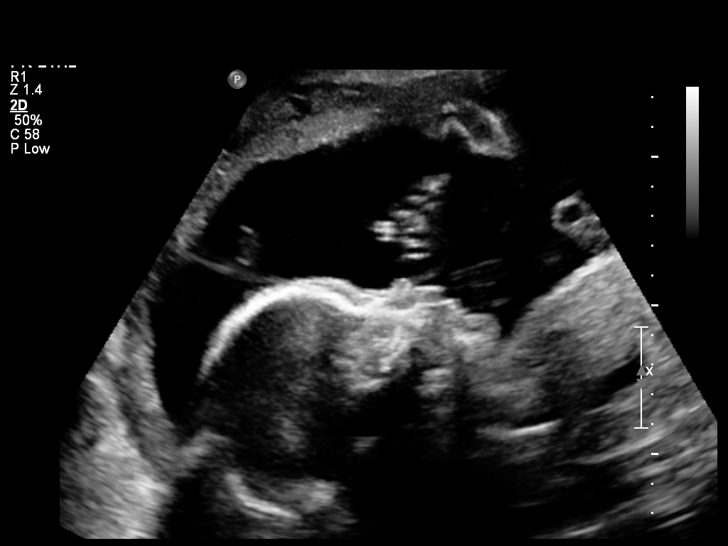

[Series 1: us ob detail +14 wk · 1 of 6 slices shown (2 of 2)]
[im 1/6]
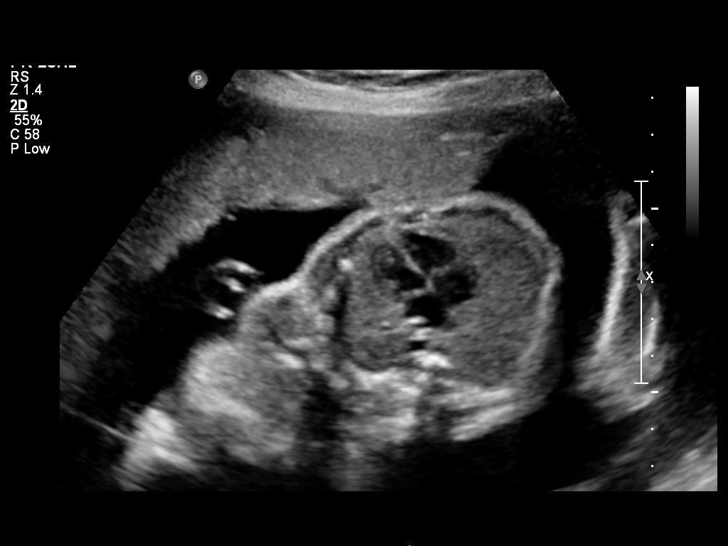

[12 of 28 positions shown; findings below may reference images not displayed]

OBSTETRICS REPORT
                      (Signed Final 12/19/2011 [DATE])

 Order#:         [id number]
Procedures

 US OB DETAIL + 14 WK                                  76811.0
Indications

 Detailed fetal anatomic survey
Fetal Evaluation

 Fetal Heart Rate:  142                         bpm
 Cardiac Activity:  Observed
 Presentation:      Breech
 Placenta:          Anterior, above cervical os
 P. Cord            Appears WNL
 Insertion:

 Amniotic Fluid
 AFI FV:      Subjectively within normal limits
                                             Larg Pckt:     6.0  cm
Biometry

 BPD:       66  mm    G. Age:   26w 4d                CI:        73.09   70 - 86
                                                      FL/HC:      18.8   18.6 -

 HC:     245.4  mm    G. Age:   26w 4d       53  %    HC/AC:      1.10   1.04 -

 AC:     222.8  mm    G. Age:   26w 5d       67  %    FL/BPD:     70.0   71 - 87
 FL:      46.2  mm    G. Age:   25w 3d       22  %    FL/AC:      20.7   20 - 24
 HUM:     42.5  mm    G. Age:   25w 4d       36  %
 Est. FW:     907  gm          2 lb      61  %
Gestational Age

 LMP:           25w 6d       Date:   06/21/11                 EDD:   03/27/12
 U/S Today:     26w 2d                                        EDD:   03/24/12
 Best:          25w 6d    Det. By:   LMP  (06/21/11)          EDD:   03/27/12
Anatomy

 Cranium:           Appears normal      Aortic Arch:       Appears normal
 Fetal Cavum:       Appears normal      Ductal Arch:       Appears normal
 Ventricles:        Appears normal      Diaphragm:         Appears normal
 Choroid Plexus:    Appears normal      Stomach:           Appears
                                                           normal, left
                                                           sided
 Cerebellum:        Appears normal      Abdomen:           Appears normal
 Posterior Fossa:   Appears normal      Abdominal Wall:    Appears nml
                                                           (cord insert,
                                                           abd wall)
 Nuchal Fold:       Not applicable      Cord Vessels:      Appears normal
                    (>20 wks GA)                           (3 vessel cord)
 Face:              Appears normal      Kidneys:           Appear normal
                    (lips/profile/orbit
                    s)
 Heart:             Appears normal      Bladder:           Appears normal
                    (4 chamber &
                    axis)
 RVOT:              Appears normal      Spine:             Appears normal
 LVOT:              Appears normal      Limbs:             Appears normal
                                                           (hands, ankles,
                                                           feet)

 Other:     Male gender. Nasal bone visualized. Heels and 5th digit
            visualized.
Cervix Uterus Adnexa

 Cervical Length:   4.9       cm

 Cervix:       Normal appearance by transabdominal scan.
 Left Ovary:   Within normal limits.
 Right Ovary:  Within normal limits.

 Adnexa:     No abnormality visualized.
Impression

 Single intrauterine gestation demonstrating an estimated
 gestational age by ultrasound of 26w 2d  . This correlates
 well with expected EGA by LMP of 25w 6d  .

 No focal fetal or placental abnormalities are noted with a
 good anatomic evaluation possible. No soft markers for Down
 Syndrome are seen. Sonographic modification of Down
 Syndrome risk was not performed as the patient is beyond
 the EGA at which this assessment is typically performed.

 Subjectively and quantitatively normal amniotic fluid volume.
 Normal cervical length and appearance.

 questions or concerns.

## 2014-06-26 ENCOUNTER — Encounter (HOSPITAL_COMMUNITY): Payer: Self-pay | Admitting: *Deleted

## 2015-08-26 NOTE — L&D Delivery Note (Signed)
Patient complete and pushing. SVD of viable female infant over intact perineum. Nuchal cord x 0. Infant delivered to mom's abdomen. Delayed cord clamping x 1 minute. Cord clamped x 2, cut. Spontaneous cry heard.   Cord blood obtained. Placenta delivered spontaneously and intact. LUS cleared of clot. Fundus firm on exam, pitocin running.  Lacerations: None Suture: N/A EBL: 200 cc Anesthesia: epidural, local  Apgars: 9/9 Weight: 3705g, 8# 2.7oz  Instrument and sponge count x2 correct.   Deniece ReeJosue D Santos, MD 07/02/2016 9:17 AM    OB FELLOW DELIVERY ATTESTATION  I was gloved and present for the delivery in its entirety, and I agree with the above resident's note.    Jen MowElizabeth Mumaw, DO OB Fellow

## 2015-12-01 ENCOUNTER — Encounter: Payer: Self-pay | Admitting: *Deleted

## 2015-12-19 ENCOUNTER — Ambulatory Visit (INDEPENDENT_AMBULATORY_CARE_PROVIDER_SITE_OTHER): Payer: Medicaid Other | Admitting: Advanced Practice Midwife

## 2015-12-19 ENCOUNTER — Encounter: Payer: Self-pay | Admitting: Advanced Practice Midwife

## 2015-12-19 VITALS — BP 121/77 | HR 95 | Wt 158.5 lb

## 2015-12-19 DIAGNOSIS — Z113 Encounter for screening for infections with a predominantly sexual mode of transmission: Secondary | ICD-10-CM

## 2015-12-19 DIAGNOSIS — Z3480 Encounter for supervision of other normal pregnancy, unspecified trimester: Secondary | ICD-10-CM | POA: Insufficient documentation

## 2015-12-19 DIAGNOSIS — Z3482 Encounter for supervision of other normal pregnancy, second trimester: Secondary | ICD-10-CM

## 2015-12-19 DIAGNOSIS — Z124 Encounter for screening for malignant neoplasm of cervix: Secondary | ICD-10-CM

## 2015-12-19 DIAGNOSIS — Z3689 Encounter for other specified antenatal screening: Secondary | ICD-10-CM

## 2015-12-19 DIAGNOSIS — Z3687 Encounter for antenatal screening for uncertain dates: Secondary | ICD-10-CM

## 2015-12-19 NOTE — Addendum Note (Signed)
Addended by: Cheree DittoGRAHAM, DEMETRICE A on: 12/19/2015 05:20 PM   Modules accepted: Orders

## 2015-12-19 NOTE — Progress Notes (Signed)
OB US scheduled for May 1st @ 0800.  Pt notified.

## 2015-12-19 NOTE — Addendum Note (Signed)
Addended by: Cheree DittoGRAHAM, DEMETRICE A on: 12/19/2015 05:22 PM   Modules accepted: Orders

## 2015-12-19 NOTE — Progress Notes (Signed)
   Subjective:    Doreen BeamJennifer Reaver is a [id number] 7973w1d being seen today for her first obstetrical visit.  Her obstetrical history is significant for NSVD x 2. Patient does intend to breast feed. Pregnancy history fully reviewed.  Patient reports intermittent swelling of hands and feet.  Filed Vitals:   12/19/15 0906  BP: 121/77  Pulse: 95  Weight: 158 lb 8 oz (71.895 kg)    HISTORY: OB History  Gravida Para Term Preterm AB SAB TAB Ectopic Multiple Living  3 2 2       2     # Outcome Date GA Lbr Len/2nd Weight Sex Delivery Anes PTL Lv  3 Current           2 Term 03/22/12 2474w2d 02:34 / 00:14 8 lb 8 oz (3.855 kg) M Vag-Spont None  Y  1 Term 01/05/07 5072w4d  7 lb (3.175 kg) M Vag-Spont EPI  Y     Comments: no complications, born in [place], [place] at [facility]     Past Medical History  Diagnosis Date  . No pertinent past medical history    Past Surgical History  Procedure Laterality Date  . No past surgeries     Family History  Problem Relation Age of Onset  . Hypertension Mother   . Diabetes Brother      Exam    Uterus:     Pelvic Exam:    Perineum: No Hemorrhoids, Normal Perineum   Vulva: normal   Vagina:  normal mucosa, normal discharge   pH:    Cervix: multiparous appearance   Adnexa: normal adnexa and no mass, fullness, tenderness   Bony Pelvis: average  System: Breast:  normal appearance, no masses or tenderness   Skin: normal coloration and turgor, no rashes    Neurologic: oriented, normal, gait normal; reflexes normal and symmetric   Extremities: normal strength, tone, and muscle mass, ROM of all joints is normal   HEENT sclera clear, anicteric   Mouth/Teeth mucous membranes moist, pharynx normal without lesions and dental hygiene good   Neck supple and no masses   Cardiovascular:    Respiratory:  appears well, vitals normal, no respiratory distress, acyanotic, normal RR, ear and throat exam is normal, neck free of mass or lymphadenopathy   Abdomen:  soft, non-tender; bowel sounds normal; no masses,  no organomegaly   Urinary: urethral meatus normal      Assessment:    Pregnancy: [id number] Patient Active Problem List   Diagnosis Date Noted  . Supervision of normal subsequent pregnancy 12/19/2015     1. Encounter for supervision of other normal pregnancy in second trimester  - Culture, OB Urine - Prenatal Profile - Prescript Monitor Profile(19) - GC/Chlamydia probe amp ([place]not at [facility] - US OB Transvaginal; Future - US OB Comp Less 14 Wks; Future - US MFM OB COMP + 14 WK; Future - Cytology - PAP  2. Encounter for fetal anatomic survey  - US MFM OB COMP + 14 WK; Future   Plan:     Initial labs drawn. Prenatal vitamins. Problem list reviewed and updated. Genetic Screening discussed Quad Screen: ordered.  Ultrasound discussed; fetal survey: requested.  Follow up in 4 weeks.   LEFTWICH-KIRBY, Burgandy Hackworth 12/19/2015

## 2015-12-20 LAB — CYTOLOGY - PAP

## 2015-12-20 LAB — GC/CHLAMYDIA PROBE AMP (~~LOC~~) NOT AT ARMC
Chlamydia: NEGATIVE
NEISSERIA GONORRHEA: NEGATIVE

## 2015-12-21 LAB — PRESCRIPTION MONITORING PROFILE (19 PANEL)
AMPHETAMINE/METH: NEGATIVE ng/mL
BUPRENORPHINE, URINE: NEGATIVE ng/mL
Barbiturate Screen, Urine: NEGATIVE ng/mL
Benzodiazepine Screen, Urine: NEGATIVE ng/mL
CREATININE, URINE: 187.8 mg/dL (ref >20.0)
Cannabinoid Scrn, Ur: NEGATIVE ng/mL
Carisoprodol, Urine: NEGATIVE ng/mL
Cocaine Metabolites: NEGATIVE ng/mL
ECSTASY: NEGATIVE ng/mL
Fentanyl, Ur: NEGATIVE ng/mL
MEPERIDINE UR: NEGATIVE ng/mL
METHADONE SCREEN, URINE: NEGATIVE ng/mL
Methaqualone: NEGATIVE ng/mL
NITRITES URINE, INITIAL: NEGATIVE ug/mL
OPIATE SCREEN, URINE: NEGATIVE ng/mL
OXYCODONE SCRN UR: NEGATIVE ng/mL
Phencyclidine, Ur: NEGATIVE ng/mL
Propoxyphene: NEGATIVE ng/mL
TRAMADOL UR: NEGATIVE ng/mL
Tapentadol, urine: NEGATIVE ng/mL
Zolpidem, Urine: NEGATIVE ng/mL
pH, Initial: 7.5 pH (ref 4.5–8.9)

## 2015-12-24 ENCOUNTER — Ambulatory Visit (HOSPITAL_COMMUNITY)
Admission: RE | Admit: 2015-12-24 | Discharge: 2015-12-24 | Disposition: A | Discharge status: Home / Self Care | Payer: Medicaid Other | Source: Ambulatory Visit | Attending: Advanced Practice Midwife | Admitting: Advanced Practice Midwife

## 2015-12-24 DIAGNOSIS — Z3A12 12 weeks gestation of pregnancy: Secondary | ICD-10-CM | POA: Insufficient documentation

## 2015-12-24 DIAGNOSIS — Z3482 Encounter for supervision of other normal pregnancy, second trimester: Secondary | ICD-10-CM | POA: Insufficient documentation

## 2015-12-24 DIAGNOSIS — Z3687 Encounter for antenatal screening for uncertain dates: Secondary | ICD-10-CM

## 2015-12-24 DIAGNOSIS — Z36 Encounter for antenatal screening of mother: Secondary | ICD-10-CM | POA: Insufficient documentation

## 2016-01-16 ENCOUNTER — Ambulatory Visit (INDEPENDENT_AMBULATORY_CARE_PROVIDER_SITE_OTHER): Payer: Medicaid Other | Admitting: Certified Nurse Midwife

## 2016-01-16 ENCOUNTER — Encounter: Payer: Self-pay | Admitting: Certified Nurse Midwife

## 2016-01-16 VITALS — BP 108/67 | HR 97 | Wt 161.2 lb

## 2016-01-16 DIAGNOSIS — Z3482 Encounter for supervision of other normal pregnancy, second trimester: Secondary | ICD-10-CM

## 2016-01-16 LAB — POCT URINALYSIS DIP (DEVICE)
Bilirubin Urine: NEGATIVE
Glucose, UA: NEGATIVE mg/dL
Hgb urine dipstick: NEGATIVE
Ketones, ur: NEGATIVE mg/dL
Nitrite: NEGATIVE
Protein, ur: NEGATIVE mg/dL
Specific Gravity, Urine: 1.015 (ref 1.005–1.030)
Urobilinogen, UA: 0.2 mg/dL (ref 0.0–1.0)
pH: 7 (ref 5.0–8.0)

## 2016-01-16 NOTE — Progress Notes (Signed)
Subjective:  Cristina Burke is a 25 y.o. G3P2002 at 7574w4d being seen today for ongoing prenatal care.  She is currently monitored for the following issues for this low-risk pregnancy and has Supervision of normal subsequent pregnancy on her problem list.  Patient reports no complaints.   .  .  Movement: Present. Denies leaking of fluid.   The following portions of the patient's history were reviewed and updated as appropriate: allergies, current medications, past family history, past medical history, past social history, past surgical history and problem list. Problem list updated.  Objective:   Filed Vitals:   01/16/16 0856  BP: 108/67  Pulse: 97  Weight: 161 lb 3.2 oz (73.12 kg)    Fetal Status: Fetal Heart Rate (bpm): 155   Movement: Present     General:  Alert, oriented and cooperative. Patient is in no acute distress.  Skin: Skin is warm and dry. No rash noted.   Cardiovascular: Normal heart rate noted  Respiratory: Normal respiratory effort, no problems with respiration noted  Abdomen: Soft, gravid, appropriate for gestational age. Pain/Pressure: Present     Pelvic:       Cervical exam deferred        Extremities: Normal range of motion.  Edema: None  Mental Status: Normal mood and affect. Normal behavior. Normal judgment and thought content.   Urinalysis: Urine Protein: Negative Urine Glucose: Negative  Assessment and Plan:  Pregnancy: G3P2002 at 7274w4d  1. Encounter for supervision of other normal pregnancy in second trimester  - POCT urinalysis dip (device) - Prenatal Profile - AFP, Quad Screen - US MFM OB FOLLOW UP; Future  Preterm labor symptoms and general obstetric precautions including but not limited to vaginal bleeding, contractions, leaking of fluid and fetal movement were reviewed in detail with the patient. Please refer to After Visit Summary for other counseling recommendations.  Return in about 4 weeks (around 02/13/2016).   Rhea PinkLori A Caileigh Canche, CNM

## 2016-01-16 NOTE — Patient Instructions (Signed)
Alpha-Fetoprotein Test  WHY AM I HAVING THIS TEST?  This test is used to screen for birth defects, such as chromosomal abnormalities, neural tube defects, and body wall defects. It can also be used as a tumor marker for certain cancers.   Alpha-Fetoprotein (AFP) is a protein that is made by the fetal liver. Levels can be detected during pregnancy, starting at [redacted] weeks gestation and peaking at 16-[redacted] weeks gestation. Your health care provider may perform this test if you are pregnant or if a tumor is suspected.  WHAT KIND OF SAMPLE IS TAKEN?  A blood sample is required for this test. It is usually collected by inserting a needle into a vein.  HOW DO I PREPARE FOR THE TEST?  There is no preparation or fasting required for this test.  WHAT ARE THE REFERENCE RANGES?  References ranges are considered healthy ranges established after testing a large group of healthy people. Reference ranges may vary among different people, labs, and hospitals. It is your responsibility to obtain your test results. Ask the lab or department performing the test when and how you will get your results.  Reference ranges for AFP are the following:  · Adult: Less than 40 ng/mL or less than 40 mcg/L (SI units).  · Child younger than 1 year: Less than 30 ng/mL.  Ranges are stratified by weeks of gestation, and they vary among laboratories.  WHAT DO THE RESULTS MEAN?  Values above the reference ranges in pregnant women may indicate:  · Neural tube defects.  · Abdominal wall defects.  · Multiple pregnancy.  · Congenital abnormalities.  · Fetal distress or fetal death.  Values above the reference ranges in nonpregnant women may indicate:  · Reproductive cancers.  · Liver cancer.  · Liver cell death.  · Other types of cancer.  Values below the reference ranges in pregnant women may indicate:  · Down syndrome.  · Fetal death.  Talk with your health care provider to discuss your results, treatment options, and if necessary, the need for more tests. Talk  with your health care provider if you have any questions about your results.     This information is not intended to replace advice given to you by your health care provider. Make sure you discuss any questions you have with your health care provider.     Document Released: 09/04/2004 Document Revised: 09/01/2014 Document Reviewed: 01/13/2014  Elsevier Interactive Patient Education ©2016 [employer]

## 2016-01-17 LAB — AFP, QUAD SCREEN
AFP: 16.7 ng/mL
Age Alone: 1:1040 {titer}
Curr Gest Age: 15.6 weeks
Down Syndrome Scr Risk Est: 1:1150 {titer}
HCG, Total: 24.32 IU/mL
INH: 205.6 pg/mL
Interpretation-AFP: NEGATIVE
MoM for AFP: 0.61
MoM for INH: 1.45
MoM for hCG: 0.7
Open Spina bifida: NEGATIVE
Osb Risk: <1:27300 {titer}
Tri 18 Scr Risk Est: NEGATIVE
Trisomy 18 (Edward) Syndrome Interp.: 1:5420 {titer}
uE3 Mom: 0.65
uE3 Value: 0.57 ng/mL

## 2016-01-18 LAB — PRENATAL PROFILE (SOLSTAS)
Antibody Screen: NEGATIVE
Basophils Absolute: 0 cells/uL (ref 0–200)
Basophils Relative: 0 %
Eosinophils Absolute: 174 cells/uL (ref 15–500)
Eosinophils Relative: 2 %
HCT: 38 % (ref 35.0–45.0)
HIV 1&2 Ab, 4th Generation: NONREACTIVE
Hemoglobin: 12.6 g/dL (ref 11.7–15.5)
Hepatitis B Surface Ag: NEGATIVE
Lymphocytes Relative: 14 %
Lymphs Abs: 1218 cells/uL (ref 850–3900)
MCH: 29.2 pg (ref 27.0–33.0)
MCHC: 33.2 g/dL (ref 32.0–36.0)
MCV: 88.2 fL (ref 80.0–100.0)
MPV: 10.9 fL (ref 7.5–12.5)
Monocytes Absolute: 522 cells/uL (ref 200–950)
Monocytes Relative: 6 %
Neutro Abs: 6786 cells/uL (ref 1500–7800)
Neutrophils Relative %: 78 %
Platelets: 252 10*3/uL (ref 140–400)
RBC: 4.31 MIL/uL (ref 3.80–5.10)
RDW: 13 % (ref 11.0–15.0)
Rh Type: POSITIVE
Rubella: 2.87 Index — ABNORMAL HIGH (ref <0.90)
WBC: 8.7 10*3/uL (ref 3.8–10.8)

## 2016-01-29 ENCOUNTER — Encounter (HOSPITAL_COMMUNITY): Payer: Self-pay | Admitting: Certified Nurse Midwife

## 2016-02-11 ENCOUNTER — Ambulatory Visit (HOSPITAL_COMMUNITY)
Admission: RE | Admit: 2016-02-11 | Discharge: 2016-02-11 | Disposition: A | Discharge status: Home / Self Care | Payer: Medicaid Other | Source: Ambulatory Visit | Attending: Certified Nurse Midwife | Admitting: Certified Nurse Midwife

## 2016-02-11 ENCOUNTER — Other Ambulatory Visit: Payer: Self-pay | Admitting: Advanced Practice Midwife

## 2016-02-11 DIAGNOSIS — Z3689 Encounter for other specified antenatal screening: Secondary | ICD-10-CM

## 2016-02-11 DIAGNOSIS — Z36 Encounter for antenatal screening of mother: Secondary | ICD-10-CM | POA: Insufficient documentation

## 2016-02-11 DIAGNOSIS — Z3A19 19 weeks gestation of pregnancy: Secondary | ICD-10-CM

## 2016-02-11 DIAGNOSIS — Z3482 Encounter for supervision of other normal pregnancy, second trimester: Secondary | ICD-10-CM

## 2016-02-13 ENCOUNTER — Ambulatory Visit (INDEPENDENT_AMBULATORY_CARE_PROVIDER_SITE_OTHER): Payer: Medicaid Other | Admitting: Family

## 2016-02-13 VITALS — BP 111/66 | HR 80 | Wt 164.7 lb

## 2016-02-13 DIAGNOSIS — Z3482 Encounter for supervision of other normal pregnancy, second trimester: Secondary | ICD-10-CM

## 2016-02-13 LAB — POCT URINALYSIS DIP (DEVICE)
Bilirubin Urine: NEGATIVE
Glucose, UA: NEGATIVE mg/dL
HGB URINE DIPSTICK: NEGATIVE
Ketones, ur: NEGATIVE mg/dL
NITRITE: NEGATIVE
PH: 7 (ref 5.0–8.0)
Protein, ur: NEGATIVE mg/dL
Specific Gravity, Urine: 1.02 (ref 1.005–1.030)
UROBILINOGEN UA: 0.2 mg/dL (ref 0.0–1.0)

## 2016-02-13 NOTE — Patient Instructions (Addendum)
Sterilization Information, Female Female sterilization is a procedure to permanently prevent pregnancy. There are different ways to perform sterilization, but all either block or close the fallopian tubes so that your eggs cannot reach your uterus. If your egg cannot reach your uterus, sperm cannot fertilize the egg, and you cannot get pregnant.  Sterilization is performed by a surgical procedure. Sometimes these procedures are performed in a hospital while a patient is asleep. Sometimes they can be done in a clinic setting with the patient awake. The fallopian tubes can be surgically cut, tied, or sealed through a procedure called tubal ligation. The fallopian tubes can also be closed with clips or rings. Sterilization can also be done by placing a tiny coil into each fallopian tube, which causes scar tissue to grow inside the tube. The scar tissue then blocks the tubes.  Discuss sterilization with your caregiver to answer any concerns you or your partner may have. You may want to ask what type of sterilization your caregiver performs. Some caregivers may not perform all the various options. Sterilization is permanent and should only be done if you are sure you do not want children or do not want any more children. Having a sterilization reversed may not be successful.  STERILIZATION PROCEDURES  Laparoscopic sterilization. This is a surgical method performed at a time other than right after childbirth. Two incisions are made in the lower abdomen. A thin, lighted tube (laparoscope) is inserted into one of the incisions and is used to perform the procedure. The fallopian tubes are closed with a ring or a clip. An instrument that uses heat could be used to seal the tubes closed (electrocautery).   Mini-laparotomy. This is a surgical method done 1 or 2 days after giving birth. Typically, a small incision is made just below the belly button (umbilicus) and the fallopian tubes are exposed. The tubes can then be  sealed, tied, or cut.   Hysteroscopic sterilization. This is performed at a time other than right after childbirth. A tiny, spring-like coil is inserted through the cervix and uterus and placed into the fallopian tubes. The coil causes scaring and blocks the tubes. Other forms of contraception should be used for 3 months after the procedure to allow the scar tissue to form completely. Additionally, it is required hysterosalpingography be done 3 months later to ensure that the procedure was successful. Hysterosalpingography is a procedure that uses X-rays to look at your uterus and fallopian tubes after a material to make them show up better has been inserted. IS STERILIZATION SAFE? Sterilization is considered safe with very rare complications. Risks depend on the type of procedure you have. As with any surgical procedure, there are risks. Some risks of sterilization by any means include:   Bleeding.  Infection.  Reaction to anesthesia medicine.  Injury to surrounding organs. Risks specific to having hysteroscopic coils placed include:  The coils may not be placed correctly the first time.   The coils may move out of place.   The tubes may not get completely blocked after 3 months.   Injury to surrounding organs when placing the coil.  HOW EFFECTIVE IS FEMALE STERILIZATION? Sterilization is nearly 100% effective, but it can fail. Depending on the type of sterilization, the rate of failure can be as high as 3%. After hysteroscopic sterilization with placement of fallopian tube coils, you will need back-up birth control for 3 months after the procedure. Sterilization is effective for a lifetime.  BENEFITS OF STERILIZATION  It does   not affect your hormones, and therefore will not affect your menstrual periods, sexual desire, or performance.   It is effective for a lifetime.   It is safe.   You do not need to worry about getting pregnant. Keep in mind that if you had the  hysteroscopic placement procedure, you must wait 3 months after the procedure (or until your caregiver confirms) before pregnancy is not considered possible.   There are no side effects unlike other types of birth control (contraception).  DRAWBACKS OF STERILIZATION  You must be sure you do not want children or any more children. The procedure is permanent.   It does not provide protection against sexually transmitted infections (STIs).   The tubes can grow back together. If this happens, there is a risk of pregnancy. There is also an increased risk (50%) of pregnancy being an ectopic pregnancy. This is a pregnancy that happens outside of the uterus.   This information is not intended to replace advice given to you by your health care provider. Make sure you discuss any questions you have with your health care provider.   Document Released: 01/28/2008 Document Revised: 08/16/2013 Document Reviewed: 11/27/2011 Elsevier Interactive Patient Education 2016 [employer]  AREA PEDIATRIC/FAMILY PRACTICE PHYSICIANS  [facility] [address] [place], [place] [address] Phone - [phone]   Fax - [phone]  JACK AMOS [address] [place], [place]  [address] Phone - [phone]   Fax - [phone]  [facility] [address], [address] [place], [place]  [address] Phone - [phone]   Fax - [phone]  [facility] [address] [place], [place]  [address] Phone - [phone]   Fax - [phone]  [facility] [address], [address] [place], [place]  [address] Phone - [phone]   Fax - [phone]  [facility] [address], [address] [place], [place]  [address] Phone - [phone]   Fax - [phone]  [facility] [address], [address] [place], [place]  [address] Phone - [phone]   Fax - [phone]  [facility] [address], [address] [place], [place]  [address] Phone - [phone]   Fax - [phone]  [facility] [address] [place], [place]  [address] Phone - [phone]   Fax - [phone] [facility] [address] [place], [place]  [address] Phone - [phone]   Fax - [phone]  [facility] [address] [place], [place]  [address] Phone - [phone]   Fax - [phone]  [facility] [address], [address] [place], [place]  [address] Phone - [phone]   Fax - [phone]  [facility] [address], [address] [place], [place]  [address] Phone - [phone]   Fax - [phone]  [facility] [address], [address] [place] [place], [place]  [address] Phone - [phone]  [facility] [address] [place], [place]  [address] Phone - [phone]   Fax - [phone]  [facility] [address], [address] [place], [place]  [address] Phone - [phone]   Fax - [phone]  [facility] [address] [place], [place]  [address] Phone - [phone]   Fax - [phone]  [facility] [address] [place], [place]  [address] Phone - [phone]   Fax -  [phone]   [facility] [address], [address] [place], [place]  [address] Phone - [phone]   Fax - [phone]  [facility] [address], [address] [place], [place]  [address] Phone - [phone]   Fax - [phone]  DAVID RUBIN [address] [place], [place]  [address] Phone - [phone]   Fax - [phone]  [facility] [address] [place], [place]  [address] Phone - [phone]   Fax - [phone]  [facility] Alita Chyle [address] [place], [place]  [address] Phone - [phone]   Fax - [phone] Gerarda Fraction [address] [place], [place]  [address] Phone - [phone]   Fax - [phone]  [facility] [address] [place], [place]  [address] Phone - [phone]   Fax - [phone]  [facility] [place] [address] [place], [place]  [address] Phone - [phone]   Fax - [phone]   Zantac 75 mg by mouth daily, may increase to twice per day

## 2016-02-13 NOTE — Progress Notes (Signed)
Subjective:  Cristina Burke is a 25 y.o. G3P2002 at 5590w4d being seen today for ongoing prenatal care.  She is currently monitored for the following issues for this low-risk pregnancy and has Supervision of normal subsequent pregnancy on her problem list.  Patient reports heartburn and excess saliva.  Contractions: Not present. Vag. Bleeding: None.  Movement: Present. Denies leaking of fluid.   The following portions of the patient's history were reviewed and updated as appropriate: allergies, current medications, past family history, past medical history, past social history, past surgical history and problem list. Problem list updated.  Objective:   Filed Vitals:   02/13/16 0850  BP: 111/66  Pulse: 80  Weight: 164 lb 11.2 oz (74.707 kg)    Fetal Status: Fetal Heart Rate (bpm): 160   Movement: Present     General:  Alert, oriented and cooperative. Patient is in no acute distress.  Skin: Skin is warm and dry. No rash noted.   Cardiovascular: Normal heart rate noted  Respiratory: Normal respiratory effort, no problems with respiration noted  Abdomen: Soft, gravid, appropriate for gestational age. Pain/Pressure: Absent     Pelvic: Vag. Bleeding: None     Cervical exam deferred        Extremities: Normal range of motion.  Edema: Trace  Mental Status: Normal mood and affect. Normal behavior. Normal judgment and thought content.   Urinalysis: Urine Protein: Negative Urine Glucose: Negative  Assessment and Plan:  Pregnancy: G3P2002 at 9190w4d  1. Encounter for supervision of other normal pregnancy in second trimester -recommend Zantac daily for reflux -desires BTL, sign papers at 28 wks -peds list provided  Preterm labor symptoms and general obstetric precautions including but not limited to vaginal bleeding, contractions, leaking of fluid and fetal movement were reviewed in detail with the patient. Please refer to After Visit Summary for other counseling recommendations.  Return in  about 4 weeks (around 03/12/2016).   Donette LarryMelanie Lygia Olaes, CNM

## 2016-03-12 ENCOUNTER — Ambulatory Visit (INDEPENDENT_AMBULATORY_CARE_PROVIDER_SITE_OTHER): Payer: Medicaid Other | Admitting: Clinical

## 2016-03-12 ENCOUNTER — Ambulatory Visit (INDEPENDENT_AMBULATORY_CARE_PROVIDER_SITE_OTHER): Payer: Medicaid Other | Admitting: Advanced Practice Midwife

## 2016-03-12 VITALS — BP 124/64 | HR 96 | Wt 167.0 lb

## 2016-03-12 DIAGNOSIS — Z3482 Encounter for supervision of other normal pregnancy, second trimester: Secondary | ICD-10-CM | POA: Diagnosis not present

## 2016-03-12 DIAGNOSIS — F410 Panic disorder [episodic paroxysmal anxiety] without agoraphobia: Secondary | ICD-10-CM | POA: Diagnosis not present

## 2016-03-12 LAB — POCT URINALYSIS DIP (DEVICE)
BILIRUBIN URINE: NEGATIVE
Glucose, UA: NEGATIVE mg/dL
Hgb urine dipstick: NEGATIVE
Ketones, ur: NEGATIVE mg/dL
LEUKOCYTES UA: NEGATIVE
NITRITE: NEGATIVE
Protein, ur: NEGATIVE mg/dL
Specific Gravity, Urine: 1.02 (ref 1.005–1.030)
Urobilinogen, UA: 0.2 mg/dL (ref 0.0–1.0)
pH: 7 (ref 5.0–8.0)

## 2016-03-12 NOTE — Progress Notes (Signed)
Breastfeeding discussed with patient  

## 2016-03-12 NOTE — Progress Notes (Signed)
Subjective:  Cristina BeamJennifer Burke is a 25 y.o. G3P2002 at 6067w4d being seen today for ongoing prenatal care.  She is currently monitored for the following issues for this low-risk pregnancy and has Supervision of normal subsequent pregnancy on her problem list.  Patient reports some panic attacks as well as some concenrs about stress and depression.  Contractions: Not present. Vag. Bleeding: None.  Movement: Present. Denies leaking of fluid.   The following portions of the patient's history were reviewed and updated as appropriate: allergies, current medications, past family history, past medical history, past social history, past surgical history and problem list. Problem list updated.  Objective:   Filed Vitals:   03/12/16 0752  BP: 124/64  Pulse: 96  Weight: 167 lb (75.751 kg)    Fetal Status: Fetal Heart Rate (bpm): 152   Movement: Present     General:  Alert, oriented and cooperative. Patient is in no acute distress.  Skin: Skin is warm and dry. No rash noted.   Cardiovascular: Normal heart rate noted  Respiratory: Normal respiratory effort, no problems with respiration noted  Abdomen: Soft, gravid, appropriate for gestational age. Pain/Pressure: Present     Pelvic:  Cervical exam deferred        Extremities: Normal range of motion.  Edema: Trace  Mental Status: Normal mood and affect. Normal behavior. Normal judgment and thought content.   Urinalysis:      Assessment and Plan:  Pregnancy: G3P2002 at 367w4d  1. Encounter for supervision of other normal pregnancy in second trimester Pt with some concerns about panic attacks and derpession. Had social worker see pt. Advised breathing exercises.   Preterm labor symptoms and general obstetric precautions including but not limited to vaginal bleeding, contractions, leaking of fluid and fetal movement were reviewed in detail with the patient. Please refer to After Visit Summary for other counseling recommendations.  No Follow-up on  file.   Lorne SkeensNicholas Michael Schenk, MD

## 2016-03-12 NOTE — Progress Notes (Signed)
  ASSESSMENT: Pt currently experiencing Panic disorder without agoraphobia. [facility] Pt would benefit from psychoeducation and brief therapeutic interventions.   Stage of Change: determination  PLAN: 1. F/U with behavioral health clinician in one month 2. Psychiatric Medications: none 3. Behavioral recommendations:   -Practice daily relaxation breathing exercise, as practiced in office visit, prior to sleep -Read educational material regarding coping with symptoms of anxiety with panic attacks  SUBJECTIVE: Pt. referred by Dorathy KinsmanVirginia Smith, CNM,  for panic attacks Pt. reports the following symptoms/concerns: Pt primary concern today is the fear of having another panic attack at work. 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 Pt is very open to learning new strategies to cope with feelings of anxiety.  Duration of problem: Over six months Severity: mild   OBJECTIVE: Orientation & Cognition: Oriented x3. Thought processes normal and appropriate to situation. Mood: appropriate Affect: appropriate Appearance: appropriate Risk of harm to self or others: no known risk of harm to self or others Substance use: none Assessments administered: PHQ9: 7/ GAD7: 3  Diagnosis: Panic disorder without agoraphobia CPT Code: F41.0  -------------------------------------------- Other(s) present in the room: none  Time spent with patient in exam room: 20 minutes 8:15 to 8:35am    Depression screen PHQ 2/9 03/12/2016  Decreased Interest 2  Down, Depressed, Hopeless 1  PHQ - 2 Score 3  Altered sleeping 0  Tired, decreased energy 3  Change in appetite 1  Feeling bad or failure about yourself  0  Trouble concentrating 0  Moving slowly or fidgety/restless 0  Suicidal thoughts 0  PHQ-9 Score 7    GAD 7 : Generalized Anxiety Score 03/12/2016  Nervous, Anxious, on  Edge 0  Control/stop worrying 1  Worry too much - different things 1  Trouble relaxing 0  Restless 0  Easily annoyed or irritable 1  Afraid - awful might happen 0  Total GAD 7 Score 3

## 2016-03-12 NOTE — Patient Instructions (Signed)
Second Trimester of Pregnancy The second trimester is from week 13 through week 28, months 4 through 6. The second trimester is often a time when you feel your best. Your body has also adjusted to being pregnant, and you begin to feel better physically. Usually, morning sickness has lessened or quit completely, you may have more energy, and you may have an increase in appetite. The second trimester is also a time when the fetus is growing rapidly. At the end of the sixth month, the fetus is about 9 inches long and weighs about 1 pounds. You will likely begin to feel the baby move (quickening) between 18 and 20 weeks of the pregnancy. BODY CHANGES Your body goes through many changes during pregnancy. The changes vary from woman to woman.   Your weight will continue to increase. You will notice your lower abdomen bulging out.  You may begin to get stretch marks on your hips, abdomen, and breasts.  You may develop headaches that can be relieved by medicines approved by your health care provider.  You may urinate more often because the fetus is pressing on your bladder.  You may develop or continue to have heartburn as a result of your pregnancy.  You may develop constipation because certain hormones are causing the muscles that push waste through your intestines to slow down.  You may develop hemorrhoids or swollen, bulging veins (varicose veins).  You may have back pain because of the weight gain and pregnancy hormones relaxing your joints between the bones in your pelvis and as a result of a shift in weight and the muscles that support your balance.  Your breasts will continue to grow and be tender.  Your gums may bleed and may be sensitive to brushing and flossing.  Dark spots or blotches (chloasma, mask of pregnancy) may develop on your face. This will likely fade after the baby is born.  A dark line from your belly button to the pubic area (linea nigra) may appear. This will likely fade  after the baby is born.  You may have changes in your hair. These can include thickening of your hair, rapid growth, and changes in texture. Some women also have hair loss during or after pregnancy, or hair that feels dry or thin. Your hair will most likely return to normal after your baby is born. WHAT TO EXPECT AT YOUR PRENATAL VISITS During a routine prenatal visit:  You will be weighed to make sure you and the fetus are growing normally.  Your blood pressure will be taken.  Your abdomen will be measured to track your baby's growth.  The fetal heartbeat will be listened to.  Any test results from the previous visit will be discussed. Your health care provider may ask you:  How you are feeling.  If you are feeling the baby move.  If you have had any abnormal symptoms, such as leaking fluid, bleeding, severe headaches, or abdominal cramping.  If you are using any tobacco products, including cigarettes, chewing tobacco, and electronic cigarettes.  If you have any questions. Other tests that may be performed during your second trimester include:  Blood tests that check for:  Low iron levels (anemia).  Gestational diabetes (between 24 and 28 weeks).  Rh antibodies.  Urine tests to check for infections, diabetes, or protein in the urine.  An ultrasound to confirm the proper growth and development of the baby.  An amniocentesis to check for possible genetic problems.  Fetal screens for spina bifida   and Down syndrome.  HIV (human immunodeficiency virus) testing. Routine prenatal testing includes screening for HIV, unless you choose not to have this test. HOME CARE INSTRUCTIONS   Avoid all smoking, herbs, alcohol, and unprescribed drugs. These chemicals affect the formation and growth of the baby.  Do not use any tobacco products, including cigarettes, chewing tobacco, and electronic cigarettes. If you need help quitting, ask your health care provider. You may receive  counseling support and other resources to help you quit.  Follow your health care provider's instructions regarding medicine use. There are medicines that are either safe or unsafe to take during pregnancy.  Exercise only as directed by your health care provider. Experiencing uterine cramps is a good sign to stop exercising.  Continue to eat regular, healthy meals.  Wear a good support bra for breast tenderness.  Do not use hot tubs, steam rooms, or saunas.  Wear your seat belt at all times when driving.  Avoid raw meat, uncooked cheese, cat litter boxes, and soil used by cats. These carry germs that can cause birth defects in the baby.  Take your prenatal vitamins.  Take 1500-2000 mg of calcium daily starting at the 20th week of pregnancy until you deliver your baby.  Try taking a stool softener (if your health care provider approves) if you develop constipation. Eat more high-fiber foods, such as fresh vegetables or fruit and whole grains. Drink plenty of fluids to keep your urine clear or pale yellow.  Take warm sitz baths to soothe any pain or discomfort caused by hemorrhoids. Use hemorrhoid cream if your health care provider approves.  If you develop varicose veins, wear support hose. Elevate your feet for 15 minutes, 3-4 times a day. Limit salt in your diet.  Avoid heavy lifting, wear low heel shoes, and practice good posture.  Rest with your legs elevated if you have leg cramps or low back pain.  Visit your dentist if you have not gone yet during your pregnancy. Use a soft toothbrush to brush your teeth and be gentle when you floss.  A sexual relationship may be continued unless your health care provider directs you otherwise.  Continue to go to all your prenatal visits as directed by your health care provider. SEEK MEDICAL CARE IF:   You have dizziness.  You have mild pelvic cramps, pelvic pressure, or nagging pain in the abdominal area.  You have persistent nausea,  vomiting, or diarrhea.  You have a bad smelling vaginal discharge.  You have pain with urination. SEEK IMMEDIATE MEDICAL CARE IF:   You have a fever.  You are leaking fluid from your vagina.  You have spotting or bleeding from your vagina.  You have severe abdominal cramping or pain.  You have rapid weight gain or loss.  You have shortness of breath with chest pain.  You notice sudden or extreme swelling of your face, hands, ankles, feet, or legs.  You have not felt your baby move in over an hour.  You have severe headaches that do not go away with medicine.  You have vision changes.   This information is not intended to replace advice given to you by your health care provider. Make sure you discuss any questions you have with your health care provider.   Document Released: 08/05/2001 Document Revised: 09/01/2014 Document Reviewed: 10/12/2012 Elsevier Interactive Patient Education 2016 [employer] Panic Attacks Panic attacks are sudden, short-livedsurges of severe anxiety, fear, or discomfort. They may occur for no reason when you are  relaxed, when you are anxious, or when you are sleeping. Panic attacks may occur for a number of reasons:   Healthy people occasionally have panic attacks in extreme, life-threatening situations, such as war or natural disasters. Normal anxiety is a protective mechanism of the body that helps us react to danger (fight or flight response).  Panic attacks are often seen with anxiety disorders, such as panic disorder, social anxiety disorder, generalized anxiety disorder, and phobias. Anxiety disorders cause excessive or uncontrollable anxiety. They may interfere with your relationships or other life activities.  Panic attacks are sometimes seen with other mental illnesses, such as depression and posttraumatic stress disorder.  Certain medical conditions, prescription medicines, and drugs of abuse can cause panic attacks. SYMPTOMS  Panic  attacks start suddenly, peak within 20 minutes, and are accompanied by four or more of the following symptoms:  Pounding heart or fast heart rate (palpitations).  Sweating.  Trembling or shaking.  Shortness of breath or feeling smothered.  Feeling choked.  Chest pain or discomfort.  Nausea or strange feeling in your stomach.  Dizziness, light-headedness, or feeling like you will faint.  Chills or hot flushes.  Numbness or tingling in your lips or hands and feet.  Feeling that things are not real or feeling that you are not yourself.  Fear of losing control or going crazy.  Fear of dying. Some of these symptoms can mimic serious medical conditions. For example, you may think you are having a heart attack. Although panic attacks can be very scary, they are not life threatening. DIAGNOSIS  Panic attacks are diagnosed through an assessment by your health care provider. Your health care provider will ask questions about your symptoms, such as where and when they occurred. Your health care provider will also ask about your medical history and use of alcohol and drugs, including prescription medicines. Your health care provider may order blood tests or other studies to rule out a serious medical condition. Your health care provider may refer you to a mental health professional for further evaluation. TREATMENT   Most healthy people who have one or two panic attacks in an extreme, life-threatening situation will not require treatment.  The treatment for panic attacks associated with anxiety disorders or other mental illness typically involves counseling with a mental health professional, medicine, or a combination of both. Your health care provider will help determine what treatment is best for you.  Panic attacks due to physical illness usually go away with treatment of the illness. If prescription medicine is causing panic attacks, talk with your health care provider about stopping the  medicine, decreasing the dose, or substituting another medicine.  Panic attacks due to alcohol or drug abuse go away with abstinence. Some adults need professional help in order to stop drinking or using drugs. HOME CARE INSTRUCTIONS   Take all medicines as directed by your health care provider.   Schedule and attend follow-up visits as directed by your health care provider. It is important to keep all your appointments. SEEK MEDICAL CARE IF:  You are not able to take your medicines as prescribed.  Your symptoms do not improve or get worse. SEEK IMMEDIATE MEDICAL CARE IF:   You experience panic attack symptoms that are different than your usual symptoms.  You have serious thoughts about hurting yourself or others.  You are taking medicine for panic attacks and have a serious side effect. MAKE SURE YOU:  Understand these instructions.  Will watch your condition.  Will get help  right away if you are not doing well or get worse.   This information is not intended to replace advice given to you by your health care provider. Make sure you discuss any questions you have with your health care provider.   Document Released: 08/11/2005 Document Revised: 08/16/2013 Document Reviewed: 03/25/2013 Elsevier Interactive Patient Education [employer]

## 2016-04-11 ENCOUNTER — Ambulatory Visit (INDEPENDENT_AMBULATORY_CARE_PROVIDER_SITE_OTHER): Payer: Medicaid Other | Admitting: Obstetrics & Gynecology

## 2016-04-11 VITALS — BP 113/74 | HR 101 | Wt 167.2 lb

## 2016-04-11 DIAGNOSIS — Z3482 Encounter for supervision of other normal pregnancy, second trimester: Secondary | ICD-10-CM

## 2016-04-11 DIAGNOSIS — Z23 Encounter for immunization: Secondary | ICD-10-CM

## 2016-04-11 LAB — POCT URINALYSIS DIP (DEVICE)
BILIRUBIN URINE: NEGATIVE
GLUCOSE, UA: NEGATIVE mg/dL
Hgb urine dipstick: NEGATIVE
KETONES UR: NEGATIVE mg/dL
NITRITE: NEGATIVE
Protein, ur: NEGATIVE mg/dL
Specific Gravity, Urine: 1.02 (ref 1.005–1.030)
Urobilinogen, UA: 0.2 mg/dL (ref 0.0–1.0)
pH: 7 (ref 5.0–8.0)

## 2016-04-11 LAB — CBC
HEMATOCRIT: 33.3 % — AB (ref 35.0–45.0)
HEMOGLOBIN: 10.9 g/dL — AB (ref 11.7–15.5)
MCH: 28.2 pg (ref 27.0–33.0)
MCHC: 32.7 g/dL (ref 32.0–36.0)
MCV: 86.3 fL (ref 80.0–100.0)
MPV: 10.9 fL (ref 7.5–12.5)
Platelets: 224 10*3/uL (ref 140–400)
RBC: 3.86 MIL/uL (ref 3.80–5.10)
RDW: 14.3 % (ref 11.0–15.0)
WBC: 7.5 10*3/uL (ref 3.8–10.8)

## 2016-04-11 NOTE — Addendum Note (Signed)
Addended by: Allie BossierVE, Tyresa Prindiville C on: 04/11/2016 10:26 AM   Modules accepted: Orders

## 2016-04-11 NOTE — Progress Notes (Signed)
Subjective:  Cristina Burke is a 25 y.o. [id number] at 5120w6d being seen today for ongoing prenatal care.  She is currently monitored for the following issues for this low-risk pregnancy and has Supervision of normal subsequent pregnancy on her problem list.  Patient reports no complaints.  Contractions: Not present. Vag. Bleeding: None.  Movement: Present. Denies leaking of fluid.   The following portions of the patient's history were reviewed and updated as appropriate: allergies, current medications, past family history, past medical history, past social history, past surgical history and problem list. Problem list updated.  Objective:   Vitals:   04/11/16 0927  BP: 113/74  Pulse: (!) 101  Weight: 167 lb 3.2 oz (75.8 kg)    Fetal Status: Fetal Heart Rate (bpm): 156   Movement: Present     General:  Alert, oriented and cooperative. Patient is in no acute distress.  Skin: Skin is warm and dry. No rash noted.   Cardiovascular: Normal heart rate noted  Respiratory: Normal respiratory effort, no problems with respiration noted  Abdomen: Soft, gravid, appropriate for gestational age. Pain/Pressure: Absent     Pelvic:  Cervical exam deferred        Extremities: Normal range of motion.  Edema: Trace  Mental Status: Normal mood and affect. Normal behavior. Normal judgment and thought content.   Urinalysis:      Assessment and Plan:  Pregnancy: G3P2002 at 7020w6d  1. Encounter for supervision of other normal pregnancy in second trimester  - Glucose Tolerance, 1 HR (50g) w/o Fasting - CBC - RPR - HIV antibody (with reflex) - TDAP Preterm labor symptoms and general obstetric precautions including but not limited to vaginal bleeding, contractions, leaking of fluid and fetal movement were reviewed in detail with the patient. Please refer to After Visit Summary for other counseling recommendations.  Return in about 3 weeks (around 05/02/2016) for Sign BTL forms today .   Allie BossierMyra C Lonna Rabold, MD

## 2016-04-11 NOTE — Progress Notes (Signed)
U/s for size greater than dates

## 2016-04-12 LAB — RPR

## 2016-04-12 LAB — GLUCOSE TOLERANCE, 1 HOUR (50G) W/O FASTING: Glucose, 1 Hr, gestational: 187 mg/dL — ABNORMAL HIGH (ref <140)

## 2016-04-12 LAB — HIV ANTIBODY (ROUTINE TESTING W REFLEX): HIV: NONREACTIVE

## 2016-04-15 ENCOUNTER — Ambulatory Visit: Payer: Medicaid Other

## 2016-04-15 ENCOUNTER — Encounter: Payer: Medicaid Other | Admitting: Advanced Practice Midwife

## 2016-04-18 ENCOUNTER — Ambulatory Visit (HOSPITAL_COMMUNITY)
Admission: RE | Admit: 2016-04-18 | Discharge: 2016-04-18 | Disposition: A | Discharge status: Home / Self Care | Payer: Medicaid Other | Source: Ambulatory Visit | Attending: Obstetrics & Gynecology | Admitting: Obstetrics & Gynecology

## 2016-04-18 ENCOUNTER — Other Ambulatory Visit: Payer: Self-pay | Admitting: Obstetrics & Gynecology

## 2016-04-18 DIAGNOSIS — O3660X0 Maternal care for excessive fetal growth, unspecified trimester, not applicable or unspecified: Secondary | ICD-10-CM

## 2016-04-18 DIAGNOSIS — O3663X0 Maternal care for excessive fetal growth, third trimester, not applicable or unspecified: Secondary | ICD-10-CM | POA: Diagnosis not present

## 2016-04-18 DIAGNOSIS — Z3A28 28 weeks gestation of pregnancy: Secondary | ICD-10-CM | POA: Diagnosis not present

## 2016-04-18 DIAGNOSIS — Z3482 Encounter for supervision of other normal pregnancy, second trimester: Secondary | ICD-10-CM

## 2016-05-05 ENCOUNTER — Ambulatory Visit (INDEPENDENT_AMBULATORY_CARE_PROVIDER_SITE_OTHER): Payer: Medicaid Other | Admitting: Advanced Practice Midwife

## 2016-05-05 VITALS — BP 114/67 | HR 107 | Wt 167.0 lb

## 2016-05-05 DIAGNOSIS — Z3493 Encounter for supervision of normal pregnancy, unspecified, third trimester: Secondary | ICD-10-CM | POA: Diagnosis not present

## 2016-05-05 DIAGNOSIS — Z3483 Encounter for supervision of other normal pregnancy, third trimester: Secondary | ICD-10-CM

## 2016-05-05 DIAGNOSIS — Z23 Encounter for immunization: Secondary | ICD-10-CM | POA: Diagnosis not present

## 2016-05-05 DIAGNOSIS — O26843 Uterine size-date discrepancy, third trimester: Secondary | ICD-10-CM

## 2016-05-05 LAB — POCT URINALYSIS DIP (DEVICE)
Glucose, UA: NEGATIVE mg/dL
HGB URINE DIPSTICK: NEGATIVE
KETONES UR: 40 mg/dL — AB
Leukocytes, UA: NEGATIVE
Nitrite: NEGATIVE
PH: 6 (ref 5.0–8.0)
Protein, ur: 30 mg/dL — AB
Specific Gravity, Urine: >=1.03 (ref 1.005–1.030)
Urobilinogen, UA: 0.2 mg/dL (ref 0.0–1.0)

## 2016-05-05 NOTE — Progress Notes (Signed)
   PRENATAL VISIT NOTE  Subjective:  Cristina Burke is a 25 y.o. G3P2002 at 4248w2d being seen today for ongoing prenatal care.  She is currently monitored for the following issues for this low-risk pregnancy and has Supervision of normal subsequent pregnancy on her problem list.  Patient reports occasional episodes of cramping at work causing shortness of breath.  Contractions: Irritability. Vag. Bleeding: None.  Movement: Present. Denies leaking of fluid.   The following portions of the patient's history were reviewed and updated as appropriate: allergies, current medications, past family history, past medical history, past social history, past surgical history and problem list. Problem list updated.  Objective:   Vitals:   05/05/16 1609  BP: 114/67  Pulse: (!) 107  Weight: 167 lb (75.8 kg)    Fetal Status: Fetal Heart Rate (bpm): 138 Fundal Height: 34 cm Movement: Present     General:  Alert, oriented and cooperative. Patient is in no acute distress.  Skin: Skin is warm and dry. No rash noted.   Cardiovascular: Normal heart rate noted  Respiratory: Normal respiratory effort, no problems with respiration noted  Abdomen: Soft, gravid, appropriate for gestational age. Pain/Pressure: Present     Pelvic:  Cervical exam deferred        Extremities: Normal range of motion.  Edema: Trace  Mental Status: Normal mood and affect. Normal behavior. Normal judgment and thought content.   Urinalysis: Urine Protein: 1+ Urine Glucose: Negative  Assessment and Plan:  Pregnancy: G3P2002 at 6048w2d  1. Normal pregnancy in third trimester - Flu Vaccine QUAD 36+ mos IM (Fluarix, Quad PF)  2.  Cramping occasionally associated with shortness of breath  --Increase PO fluids, mild dehydration today.  If shortness of breath or cramping persist even when at rest with good hydration, f/u in office or come to [facility] if emergent.   3. Uterine size date discrepancy pregnancy, third trimester --Pt 34 cm at  2448w2d, appropriate growth since last visit. US on 8/25 shows EFW 78%tile, AC 97%tile.  Largest baby was 8 lbs per pt. --1 hour glucose result 187, 3 hour test scheduled  Preterm labor symptoms and general obstetric precautions including but not limited to vaginal bleeding, contractions, leaking of fluid and fetal movement were reviewed in detail with the patient. Please refer to After Visit Summary for other counseling recommendations.  Return in about 2 weeks (around 05/19/2016).  Hurshel PartyLisa A Leftwich-Kirby, CNM

## 2016-05-05 NOTE — Patient Instructions (Addendum)
Third Trimester of Pregnancy The third trimester is from week 29 through week 42, months 7 through 9. The third trimester is a time when the fetus is growing rapidly. At the end of the ninth month, the fetus is about 20 inches in length and weighs 6-10 pounds.  BODY CHANGES Your body goes through many changes during pregnancy. The changes vary from woman to woman.   Your weight will continue to increase. You can expect to gain 25-35 pounds (11-16 kg) by the end of the pregnancy.  You may begin to get stretch marks on your hips, abdomen, and breasts.  You may urinate more often because the fetus is moving lower into your pelvis and pressing on your bladder.  You may develop or continue to have heartburn as a result of your pregnancy.  You may develop constipation because certain hormones are causing the muscles that push waste through your intestines to slow down.  You may develop hemorrhoids or swollen, bulging veins (varicose veins).  You may have pelvic pain because of the weight gain and pregnancy hormones relaxing your joints between the bones in your pelvis. Backaches may result from overexertion of the muscles supporting your posture.  You may have changes in your hair. These can include thickening of your hair, rapid growth, and changes in texture. Some women also have hair loss during or after pregnancy, or hair that feels dry or thin. Your hair will most likely return to normal after your baby is born.  Your breasts will continue to grow and be tender. A yellow discharge may leak from your breasts called colostrum.  Your belly button may stick out.  You may feel short of breath because of your expanding uterus.  You may notice the fetus "dropping," or moving lower in your abdomen.  You may have a bloody mucus discharge. This usually occurs a few days to a week before labor begins.  Your cervix becomes thin and soft (effaced) near your due date. WHAT TO EXPECT AT YOUR  PRENATAL EXAMS  You will have prenatal exams every 2 weeks until week 36. Then, you will have weekly prenatal exams. During a routine prenatal visit:  You will be weighed to make sure you and the fetus are growing normally.  Your blood pressure is taken.  Your abdomen will be measured to track your baby's growth.  The fetal heartbeat will be listened to.  Any test results from the previous visit will be discussed.  You may have a cervical check near your due date to see if you have effaced. At around 36 weeks, your caregiver will check your cervix. At the same time, your caregiver will also perform a test on the secretions of the vaginal tissue. This test is to determine if a type of bacteria, Group B streptococcus, is present. Your caregiver will explain this further. Your caregiver may ask you:  What your birth plan is.  How you are feeling.  If you are feeling the baby move.  If you have had any abnormal symptoms, such as leaking fluid, bleeding, severe headaches, or abdominal cramping.  If you are using any tobacco products, including cigarettes, chewing tobacco, and electronic cigarettes.  If you have any questions. Other tests or screenings that may be performed during your third trimester include:  Blood tests that check for low iron levels (anemia).  Fetal testing to check the health, activity level, and growth of the fetus. Testing is done if you have certain medical conditions or if   there are problems during the pregnancy.  HIV (human immunodeficiency virus) testing. If you are at high risk, you may be screened for HIV during your third trimester of pregnancy. FALSE LABOR You may feel small, irregular contractions that eventually go away. These are called Braxton Hicks contractions, or false labor. Contractions may last for hours, days, or even weeks before true labor sets in. If contractions come at regular intervals, intensify, or become painful, it is best to be seen  by your caregiver.  SIGNS OF LABOR   Menstrual-like cramps.  Contractions that are 5 minutes apart or less.  Contractions that start on the top of the uterus and spread down to the lower abdomen and back.  A sense of increased pelvic pressure or back pain.  A watery or bloody mucus discharge that comes from the vagina. If you have any of these signs before the 37th week of pregnancy, call your caregiver right away. You need to go to the hospital to get checked immediately. HOME CARE INSTRUCTIONS   Avoid all smoking, herbs, alcohol, and unprescribed drugs. These chemicals affect the formation and growth of the baby.  Do not use any tobacco products, including cigarettes, chewing tobacco, and electronic cigarettes. If you need help quitting, ask your health care provider. You may receive counseling support and other resources to help you quit.  Follow your caregiver's instructions regarding medicine use. There are medicines that are either safe or unsafe to take during pregnancy.  Exercise only as directed by your caregiver. Experiencing uterine cramps is a good sign to stop exercising.  Continue to eat regular, healthy meals.  Wear a good support bra for breast tenderness.  Do not use hot tubs, steam rooms, or saunas.  Wear your seat belt at all times when driving.  Avoid raw meat, uncooked cheese, cat litter boxes, and soil used by cats. These carry germs that can cause birth defects in the baby.  Take your prenatal vitamins.  Take 1500-2000 mg of calcium daily starting at the 20th week of pregnancy until you deliver your baby.  Try taking a stool softener (if your caregiver approves) if you develop constipation. Eat more high-fiber foods, such as fresh vegetables or fruit and whole grains. Drink plenty of fluids to keep your urine clear or pale yellow.  Take warm sitz baths to soothe any pain or discomfort caused by hemorrhoids. Use hemorrhoid cream if your caregiver  approves.  If you develop varicose veins, wear support hose. Elevate your feet for 15 minutes, 3-4 times a day. Limit salt in your diet.  Avoid heavy lifting, wear low heal shoes, and practice good posture.  Rest a lot with your legs elevated if you have leg cramps or low back pain.  Visit your dentist if you have not gone during your pregnancy. Use a soft toothbrush to brush your teeth and be gentle when you floss.  A sexual relationship may be continued unless your caregiver directs you otherwise.  Do not travel far distances unless it is absolutely necessary and only with the approval of your caregiver.  Take prenatal classes to understand, practice, and ask questions about the labor and delivery.  Make a trial run to the hospital.  Pack your hospital bag.  Prepare the baby's nursery.  Continue to go to all your prenatal visits as directed by your caregiver. SEEK MEDICAL CARE IF:  You are unsure if you are in labor or if your water has broken.  You have dizziness.  You have   mild pelvic cramps, pelvic pressure, or nagging pain in your abdominal area.  You have persistent nausea, vomiting, or diarrhea.  You have a bad smelling vaginal discharge.  You have pain with urination. SEEK IMMEDIATE MEDICAL CARE IF:   You have a fever.  You are leaking fluid from your vagina.  You have spotting or bleeding from your vagina.  You have severe abdominal cramping or pain.  You have rapid weight loss or gain.  You have shortness of breath with chest pain.  You notice sudden or extreme swelling of your face, hands, ankles, feet, or legs.  You have not felt your baby move in over an hour.  You have severe headaches that do not go away with medicine.  You have vision changes.   This information is not intended to replace advice given to you by your health care provider. Make sure you discuss any questions you have with your health care provider.   Document Released:  08/05/2001 Document Revised: 09/01/2014 Document Reviewed: 10/12/2012 Elsevier Interactive Patient Education 2016 [employer]   Gestational Diabetes Mellitus Gestational diabetes mellitus, often simply referred to as gestational diabetes, is a type of diabetes that some women develop during pregnancy. In gestational diabetes, the pancreas does not make enough insulin (a hormone), the cells are less responsive to the insulin that is made (insulin resistance), or both.Normally, insulin moves sugars from food into the tissue cells. The tissue cells use the sugars for energy. The lack of insulin or the lack of normal response to insulin causes excess sugars to build up in the blood instead of going into the tissue cells. As a result, high blood sugar (hyperglycemia) develops. The effect of high sugar (glucose) levels can cause many problems.  RISK FACTORS You have an increased chance of developing gestational diabetes if you have a family history of diabetes and also have one or more of the following risk factors:  A body mass index over 30 (obesity).  A previous pregnancy with gestational diabetes.  An older age at the time of pregnancy. If blood glucose levels are kept in the normal range during pregnancy, women can have a healthy pregnancy. If your blood glucose levels are not well controlled, there may be risks to you, your unborn baby (fetus), your labor and delivery, or your newborn baby.  SYMPTOMS  If symptoms are experienced, they are much like symptoms you would normally expect during pregnancy. The symptoms of gestational diabetes include:   Increased thirst (polydipsia).  Increased urination (polyuria).  Increased urination during the night (nocturia).  Weight loss. This weight loss may be rapid.  Frequent, recurring infections.  Tiredness (fatigue).  Weakness.  Vision changes, such as blurred vision.  Fruity smell to your breath.  Abdominal pain. DIAGNOSIS Diabetes  is diagnosed when blood glucose levels are increased. Your blood glucose level may be checked by one or more of the following blood tests:  A fasting blood glucose test. You will not be allowed to eat for at least 8 hours before a blood sample is taken.  A random blood glucose test. Your blood glucose is checked at any time of the day regardless of when you ate.  An oral glucose tolerance test (OGTT). Your blood glucose is measured after you have not eaten (fasted) for 1-3 hours and then after you drink a glucose-containing beverage. Since the hormones that cause insulin resistance are highest at about 24-28 weeks of a pregnancy, an OGTT is usually performed during that time. If you  have risk factors, you may be screened for undiagnosed type 2 diabetes at your first prenatal visit. TREATMENT  Gestational diabetes should be managed first with diet and exercise. Medicines may be added only if they are needed.  You will need to take diabetes medicine or insulin daily to keep blood glucose levels in the desired range.  You will need to match insulin dosing with exercise and healthy food choices. If you have gestational diabetes, your treatment goal is to maintain the following blood glucose levels:  Before meals (preprandial): at or below 95 mg/dL.  After meals (postprandial):  One hour after a meal: at or below 140 mg/dL.  Two hours after a meal: at or below 120 mg/dL. If you have pre-existing type 1 or type 2 diabetes, your treatment goal is to maintain the following blood glucose levels:  Before meals, at bedtime, and overnight: 60-99 mg/dL.  After meals: peak of 100-129 mg/dL. HOME CARE INSTRUCTIONS   Have your hemoglobin A1c level checked twice a year.  Perform daily blood glucose monitoring as directed by your health care provider. It is common to perform frequent blood glucose monitoring.  Monitor urine ketones when you are ill and as directed by your health care provider.  Take  your diabetes medicine and insulin as directed by your health care provider to maintain your blood glucose level in the desired range.  Never run out of diabetes medicine or insulin. It is needed every day.  Adjust insulin based on your intake of carbohydrates. Carbohydrates can raise blood glucose levels but need to be included in your diet. Carbohydrates provide vitamins, minerals, and fiber which are an essential part of a healthy diet. Carbohydrates are found in fruits, vegetables, whole grains, dairy products, legumes, and foods containing added sugars.  Eat healthy foods. Alternate 3 meals with 3 snacks.  Maintain a healthy weight gain. The usual total expected weight gain varies according to your prepregnancy body mass index (BMI).  Carry a medical alert card or wear your medical alert jewelry.  Carry a 15-gram carbohydrate snack with you at all times to treat low blood glucose (hypoglycemia). Some examples of 15-gram carbohydrate snacks include:  Glucose tablets, 3 or 4.  Glucose gel, 15-gram tube.  Raisins, 2 tablespoons (24 g).  Jelly beans, 6.  Animal crackers, 8.  Fruit juice, regular soda, or low-fat milk, 4 ounces (120 mL).  Gummy treats, 9.  Recognize hypoglycemia. Hypoglycemia during pregnancy occurs with blood glucose levels of 60 mg/dL and below. The risk for hypoglycemia increases when fasting or skipping meals, during or after intense exercise, and during sleep. Hypoglycemia symptoms can include:  Tremors or shakes.  Decreased ability to concentrate.  Sweating.  Increased heart rate.  Headache.  Dry mouth.  Hunger.  Irritability.  Anxiety.  Restless sleep.  Altered speech or coordination.  Confusion.  Treat hypoglycemia promptly. If you are alert and able to safely swallow, follow the 15:15 rule:  Take 15-20 grams of rapid-acting glucose or carbohydrate. Rapid-acting options include glucose gel, glucose tablets, or 4 ounces (120 mL) of fruit  juice, regular soda, or low-fat milk.  Check your blood glucose level 15 minutes after taking the glucose.  Take 15-20 grams more of glucose if the repeat blood glucose level is still 70 mg/dL or below.  Eat a meal or snack within 1 hour once blood glucose levels return to normal.  Be alert to polyuria (excess urination) and polydipsia (excess thirst) which are early signs of hyperglycemia. An early  awareness of hyperglycemia allows for prompt treatment. Treat hyperglycemia as directed by your health care provider.  Engage in at least 30 minutes of physical activity a day or as directed by your health care provider. Ten minutes of physical activity timed 30 minutes after each meal is encouraged to control postprandial blood glucose levels.  Adjust your insulin dosing and food intake as needed if you start a new exercise or sport.  Follow your sick-day plan at any time you are unable to eat or drink as usual.  Avoid tobacco and alcohol use.  Keep all follow-up visits as directed by your health care provider.  Follow the advice of your health care provider regarding your prenatal and post-delivery (postpartum) appointments, meal planning, exercise, medicines, vitamins, blood tests, other medical tests, and physical activities.  Perform daily skin and foot care. Examine your skin and feet daily for cuts, bruises, redness, nail problems, bleeding, blisters, or sores.  Brush your teeth and gums at least twice a day and floss at least once a day. Follow up with your dentist regularly.  Schedule an eye exam during the first trimester of your pregnancy or as directed by your health care provider.  Share your diabetes management plan with your workplace or school.  Stay up-to-date with immunizations.  Learn to manage stress.  Obtain ongoing diabetes education and support as needed.  Learn about and consider breastfeeding your baby.  You should have your blood sugar level checked 6-12  weeks after delivery. This is done with an oral glucose tolerance test (OGTT). SEEK MEDICAL CARE IF:   You are unable to eat food or drink fluids for more than 6 hours.  You have nausea and vomiting for more than 6 hours.  You have a blood glucose level of 200 mg/dL and you have ketones in your urine.  There is a change in mental status.  You develop vision problems.  You have a persistent headache.  You have upper abdominal pain or discomfort.  You develop an additional serious illness.  You have diarrhea for more than 6 hours.  You have been sick or have had a fever for a couple of days and are not getting better. SEEK IMMEDIATE MEDICAL CARE IF:   You have difficulty breathing.  You no longer feel the baby moving.  You are bleeding or have discharge from your vagina.  You start having premature contractions or labor. MAKE SURE YOU:  Understand these instructions.  Will watch your condition.  Will get help right away if you are not doing well or get worse.   This information is not intended to replace advice given to you by your health care provider. Make sure you discuss any questions you have with your health care provider.   Document Released: 11/17/2000 Document Revised: 09/01/2014 Document Reviewed: 03/09/2012 Elsevier Interactive Patient Education [employer]

## 2016-05-06 ENCOUNTER — Other Ambulatory Visit: Payer: Self-pay

## 2016-05-15 ENCOUNTER — Other Ambulatory Visit: Payer: Medicaid Other

## 2016-05-15 DIAGNOSIS — O24419 Gestational diabetes mellitus in pregnancy, unspecified control: Secondary | ICD-10-CM

## 2016-05-15 LAB — GLUCOSE TOLERANCE, 3 HOURS
GLUCOSE 3 HOUR GTT: 116 mg/dL (ref <145)
GLUCOSE, 1 HOUR-GESTATIONAL: 183 mg/dL (ref <190)
GLUCOSE, 2 HOUR-GESTATIONAL: 130 mg/dL (ref <165)
GLUCOSE, FASTING-GESTATIONAL: 80 mg/dL (ref 65–104)

## 2016-05-21 ENCOUNTER — Ambulatory Visit (INDEPENDENT_AMBULATORY_CARE_PROVIDER_SITE_OTHER): Payer: Medicaid Other | Admitting: Advanced Practice Midwife

## 2016-05-21 VITALS — BP 111/66 | HR 106 | Wt 167.6 lb

## 2016-05-21 DIAGNOSIS — Z3480 Encounter for supervision of other normal pregnancy, unspecified trimester: Secondary | ICD-10-CM

## 2016-05-21 DIAGNOSIS — Z3493 Encounter for supervision of normal pregnancy, unspecified, third trimester: Secondary | ICD-10-CM

## 2016-05-21 NOTE — Patient Instructions (Signed)
Braxton Hicks Contractions °Contractions of the uterus can occur throughout pregnancy. Contractions are not always a sign that you are in labor.  °WHAT ARE BRAXTON HICKS CONTRACTIONS?  °Contractions that occur before labor are called Braxton Hicks contractions, or false labor. Toward the end of pregnancy (32-34 weeks), these contractions can develop more often and may become more forceful. This is not true labor because these contractions do not result in opening (dilatation) and thinning of the cervix. They are sometimes difficult to tell apart from true labor because these contractions can be forceful and people have different pain tolerances. You should not feel embarrassed if you go to the hospital with false labor. Sometimes, the only way to tell if you are in true labor is for your health care provider to look for changes in the cervix. °If there are no prenatal problems or other health problems associated with the pregnancy, it is completely safe to be sent home with false labor and await the onset of true labor. °HOW CAN YOU TELL THE DIFFERENCE BETWEEN TRUE AND FALSE LABOR? °False Labor °· The contractions of false labor are usually shorter and not as hard as those of true labor.   °· The contractions are usually irregular.   °· The contractions are often felt in the front of the lower abdomen and in the groin.   °· The contractions may go away when you walk around or change positions while lying down.   °· The contractions get weaker and are shorter lasting as time goes on.   °· The contractions do not usually become progressively stronger, regular, and closer together as with true labor.   °True Labor °· Contractions in true labor last 30-70 seconds, become very regular, usually become more intense, and increase in frequency.   °· The contractions do not go away with walking.   °· The discomfort is usually felt in the top of the uterus and spreads to the lower abdomen and low back.   °· True labor can be  determined by your health care provider with an exam. This will show that the cervix is dilating and getting thinner.   °WHAT TO REMEMBER °· Keep up with your usual exercises and follow other instructions given by your health care provider.   °· Take medicines as directed by your health care provider.   °· Keep your regular prenatal appointments.   °· Eat and drink lightly if you think you are going into labor.   °· If Braxton Hicks contractions are making you uncomfortable:   °¨ Change your position from lying down or resting to walking, or from walking to resting.   °¨ Sit and rest in a tub of warm water.   °¨ Drink 2-3 glasses of water. Dehydration may cause these contractions.   °¨ Do slow and deep breathing several times an hour.   °WHEN SHOULD I SEEK IMMEDIATE MEDICAL CARE? °Seek immediate medical care if: °· Your contractions become stronger, more regular, and closer together.   °· You have fluid leaking or gushing from your vagina.   °· You have a fever.   °· You pass blood-tinged mucus.   °· You have vaginal bleeding.   °· You have continuous abdominal pain.   °· You have low back pain that you never had before.   °· You feel your baby's head pushing down and causing pelvic pressure.   °· Your baby is not moving as much as it used to.   °  °This information is not intended to replace advice given to you by your health care provider. Make sure you discuss any questions you have with your health care   provider. °  °Document Released: 08/11/2005 Document Revised: 08/16/2013 Document Reviewed: 05/23/2013 °Elsevier Interactive Patient Education ©2016 [employer] Inc[employer] ° °

## 2016-05-21 NOTE — Progress Notes (Signed)
   PRENATAL VISIT NOTE  Subjective:  Cristina Burke is a 25 y.o. G3P2002 at 62102w4d being seen today for ongoing prenatal care.  She is currently monitored for the following issues for this low-risk pregnancy and has Supervision of normal subsequent pregnancy on her problem list.  Patient reports no complaints.  Contractions: Irregular. Vag. Bleeding: None.  Movement: Present. Denies leaking of fluid.   The following portions of the patient's history were reviewed and updated as appropriate: allergies, current medications, past family history, past medical history, past social history, past surgical history and problem list. Problem list updated.  Objective:   Vitals:   05/21/16 1133  BP: 111/66  Pulse: (!) 106  Weight: 167 lb 9.6 oz (76 kg)    Fetal Status: Fetal Heart Rate (bpm): 133 Fundal Height: 35 cm Movement: Present     General:  Alert, oriented and cooperative. Patient is in no acute distress.  Skin: Skin is warm and dry. No rash noted.   Cardiovascular: Normal heart rate noted  Respiratory: Normal respiratory effort, no problems with respiration noted  Abdomen: Soft, gravid, appropriate for gestational age. Pain/Pressure: Present     Pelvic:  Cervical exam deferred        Extremities: Normal range of motion.  Edema: Trace  Mental Status: Normal mood and affect. Normal behavior. Normal judgment and thought content.   Urinalysis:      Assessment and Plan:  Pregnancy: G3P2002 at 29102w4d  1. Supervision of normal pregnancy, third trimester   2. Encounter for supervision of other normal pregnancy in third trimester   Preterm labor symptoms and general obstetric precautions including but not limited to vaginal bleeding, contractions, leaking of fluid and fetal movement were reviewed in detail with the patient. Please refer to After Visit Summary for other counseling recommendations.  Return in 2 weeks (on 06/04/2016).  Dorathy KinsmanVirginia Ott Zimmerle, CNM

## 2016-06-10 ENCOUNTER — Other Ambulatory Visit (HOSPITAL_COMMUNITY)
Admission: RE | Admit: 2016-06-10 | Discharge: 2016-06-10 | Disposition: A | Discharge status: Home / Self Care | Payer: Medicaid Other | Source: Ambulatory Visit | Attending: Advanced Practice Midwife | Admitting: Advanced Practice Midwife

## 2016-06-10 ENCOUNTER — Ambulatory Visit (INDEPENDENT_AMBULATORY_CARE_PROVIDER_SITE_OTHER): Payer: Medicaid Other | Admitting: Advanced Practice Midwife

## 2016-06-10 VITALS — BP 118/68 | HR 99 | Wt 169.0 lb

## 2016-06-10 DIAGNOSIS — Z113 Encounter for screening for infections with a predominantly sexual mode of transmission: Secondary | ICD-10-CM | POA: Diagnosis not present

## 2016-06-10 DIAGNOSIS — O99810 Abnormal glucose complicating pregnancy: Secondary | ICD-10-CM

## 2016-06-10 DIAGNOSIS — Z3493 Encounter for supervision of normal pregnancy, unspecified, third trimester: Secondary | ICD-10-CM

## 2016-06-10 NOTE — Progress Notes (Addendum)
   PRENATAL VISIT NOTE  Subjective:  Cristina BeamJennifer Burke is a 25 y.o. G3P2002 at 6631w3d being seen today for ongoing prenatal care.  She is currently monitored for the following issues for this low-risk pregnancy and has Encounter for supervision of normal pregnancy in multigravida on her problem list.  Patient reports occasional contractions.  Contractions: Irregular. Vag. Bleeding: None.  Movement: Present. Denies leaking of fluid.   The following portions of the patient's history were reviewed and updated as appropriate: allergies, current medications, past family history, past medical history, past social history, past surgical history and problem list. Problem list updated.  Objective:   Vitals:   06/10/16 1158  BP: 118/68  Pulse: 99  Weight: 169 lb (76.7 kg)    Fetal Status: Fetal Heart Rate (bpm): 138 Fundal Height: 38 cm Movement: Present  Presentation: Vertex  General:  Alert, oriented and cooperative. Patient is in no acute distress.  Skin: Skin is warm and dry. No rash noted.   Cardiovascular: Normal heart rate noted  Respiratory: Normal respiratory effort, no problems with respiration noted  Abdomen: Soft, gravid, appropriate for gestational age. Pain/Pressure: Present     Pelvic:  Cervical exam performed Dilation: 1 Effacement (%): 0 Station: -3  Extremities: Normal range of motion.  Edema: Trace  Mental Status: Normal mood and affect. Normal behavior. Normal judgment and thought content.   Assessment and Plan:  Pregnancy: G3P2002 at 5031w3d  1. Supervision of low-risk pregnancy, third trimester  - Culture, beta strep (group b only) - GC/Chlamydia probe amp ([place])[facility]  2. Glucose intolerance of pregnancy --Failed 1 hour glucose, passed 3 hour with one elevated value. --FH 38cm today at 5131w3d. Largest previous baby 8lbs. Recommend ADA diet, daily light exercise like walking.  Term labor symptoms and general obstetric precautions including but not limited  to vaginal bleeding, contractions, leaking of fluid and fetal movement were reviewed in detail with the patient. Please refer to After Visit Summary for other counseling recommendations.  Return in about 1 week (around 06/17/2016).  Hurshel PartyLisa A Leftwich-Kirby, CNM

## 2016-06-10 NOTE — Progress Notes (Signed)
36 wk cultures today   

## 2016-06-10 NOTE — Progress Notes (Signed)
rou

## 2016-06-10 NOTE — Patient Instructions (Signed)

## 2016-06-11 LAB — GC/CHLAMYDIA PROBE AMP (~~LOC~~) NOT AT ARMC
Chlamydia: NEGATIVE
Neisseria Gonorrhea: NEGATIVE

## 2016-06-12 LAB — CULTURE, BETA STREP (GROUP B ONLY)

## 2016-06-15 ENCOUNTER — Inpatient Hospital Stay (HOSPITAL_COMMUNITY)
Admission: AD | Admit: 2016-06-15 | Discharge: 2016-06-15 | Disposition: A | Discharge status: Home / Self Care | Payer: Medicaid Other | Source: Ambulatory Visit | Attending: Obstetrics & Gynecology | Admitting: Obstetrics & Gynecology

## 2016-06-15 ENCOUNTER — Encounter (HOSPITAL_COMMUNITY): Payer: Self-pay | Admitting: *Deleted

## 2016-06-15 DIAGNOSIS — Z3493 Encounter for supervision of normal pregnancy, unspecified, third trimester: Secondary | ICD-10-CM | POA: Insufficient documentation

## 2016-06-15 DIAGNOSIS — Z3A37 37 weeks gestation of pregnancy: Secondary | ICD-10-CM | POA: Diagnosis not present

## 2016-06-15 DIAGNOSIS — Z3480 Encounter for supervision of other normal pregnancy, unspecified trimester: Secondary | ICD-10-CM

## 2016-06-15 NOTE — Discharge Instructions (Signed)
Braxton Hicks Contractions °Contractions of the uterus can occur throughout pregnancy. Contractions are not always a sign that you are in labor.  °WHAT ARE BRAXTON HICKS CONTRACTIONS?  °Contractions that occur before labor are called Braxton Hicks contractions, or false labor. Toward the end of pregnancy (32-34 weeks), these contractions can develop more often and may become more forceful. This is not true labor because these contractions do not result in opening (dilatation) and thinning of the cervix. They are sometimes difficult to tell apart from true labor because these contractions can be forceful and people have different pain tolerances. You should not feel embarrassed if you go to the hospital with false labor. Sometimes, the only way to tell if you are in true labor is for your health care provider to look for changes in the cervix. °If there are no prenatal problems or other health problems associated with the pregnancy, it is completely safe to be sent home with false labor and await the onset of true labor. °HOW CAN YOU TELL THE DIFFERENCE BETWEEN TRUE AND FALSE LABOR? °False Labor °· The contractions of false labor are usually shorter and not as hard as those of true labor.   °· The contractions are usually irregular.   °· The contractions are often felt in the front of the lower abdomen and in the groin.   °· The contractions may go away when you walk around or change positions while lying down.   °· The contractions get weaker and are shorter lasting as time goes on.   °· The contractions do not usually become progressively stronger, regular, and closer together as with true labor.   °True Labor °· Contractions in true labor last 30-70 seconds, become very regular, usually become more intense, and increase in frequency.   °· The contractions do not go away with walking.   °· The discomfort is usually felt in the top of the uterus and spreads to the lower abdomen and low back.   °· True labor can be  determined by your health care provider with an exam. This will show that the cervix is dilating and getting thinner.   °WHAT TO REMEMBER °· Keep up with your usual exercises and follow other instructions given by your health care provider.   °· Take medicines as directed by your health care provider.   °· Keep your regular prenatal appointments.   °· Eat and drink lightly if you think you are going into labor.   °· If Braxton Hicks contractions are making you uncomfortable:   °¨ Change your position from lying down or resting to walking, or from walking to resting.   °¨ Sit and rest in a tub of warm water.   °¨ Drink 2-3 glasses of water. Dehydration may cause these contractions.   °¨ Do slow and deep breathing several times an hour.   °WHEN SHOULD I SEEK IMMEDIATE MEDICAL CARE? °Seek immediate medical care if: °· Your contractions become stronger, more regular, and closer together.   °· You have fluid leaking or gushing from your vagina.   °· You have a fever.   °· You pass blood-tinged mucus.   °· You have vaginal bleeding.   °· You have continuous abdominal pain.   °· You have low back pain that you never had before.   °· You feel your baby's head pushing down and causing pelvic pressure.   °· Your baby is not moving as much as it used to.   °  °This information is not intended to replace advice given to you by your health care provider. Make sure you discuss any questions you have with your health care   provider. °  °Document Released: 08/11/2005 Document Revised: 08/16/2013 Document Reviewed: 05/23/2013 °Elsevier Interactive Patient Education ©2016 Elsevier Inc. ° °

## 2016-06-15 NOTE — MAU Note (Signed)
Contractions since Sat night. Having some pelvic pressure. Denies vag bleeding or LOF

## 2016-06-15 NOTE — MAU Note (Signed)
Patient given the option to stay for an additional hour and be recheck or discharge home now per MD. Patient discussed with husband and is  choosing to stay and be rechecked in 1 hour instead of discharge home at this time.

## 2016-06-16 ENCOUNTER — Telehealth: Payer: Self-pay

## 2016-06-16 NOTE — Telephone Encounter (Signed)
Patient presented to office today requesting to be put on bed rest. Patient stated she is having a lot of pressure and pain. She was seen in MAU last night but only dilated 1-2. I explained to patient we can not take her out of work until the provider has given the ok. Patient has appointment on 06/19/2016. She was advised to mentioned her request to her provider. Patient verbalizes understanding at this time.

## 2016-06-19 ENCOUNTER — Ambulatory Visit (INDEPENDENT_AMBULATORY_CARE_PROVIDER_SITE_OTHER): Payer: Medicaid Other | Admitting: Obstetrics and Gynecology

## 2016-06-19 DIAGNOSIS — Z3483 Encounter for supervision of other normal pregnancy, third trimester: Secondary | ICD-10-CM

## 2016-06-19 DIAGNOSIS — Z3480 Encounter for supervision of other normal pregnancy, unspecified trimester: Secondary | ICD-10-CM

## 2016-06-19 NOTE — Patient Instructions (Signed)

## 2016-06-19 NOTE — Progress Notes (Signed)
   PRENATAL VISIT NOTE  Subjective:  Cristina Burke is a 25 y.o. G3P2002 at 3839w5d being seen today for ongoing prenatal care.  She is currently monitored for the following issues for this low-risk pregnancy and has Encounter for supervision of normal pregnancy in multigravida on her problem list.  Patient reports Pt is generally uncomfrotable. has some pelvic pressure and lower extremity swelling but concerning symptoms. .  Contractions: Irregular. Vag. Bleeding: None.  Movement: Present. Denies leaking of fluid.   The following portions of the patient's history were reviewed and updated as appropriate: allergies, current medications, past family history, past medical history, past social history, past surgical history and problem list. Problem list updated.  Objective:   Vitals:   06/19/16 1240  BP: 119/70  Pulse: (!) 101  Weight: 169 lb (76.7 kg)    Fetal Status: Fetal Heart Rate (bpm): 132   Movement: Present     General:  Alert, oriented and cooperative. Patient is in no acute distress.  Skin: Skin is warm and dry. No rash noted.   Cardiovascular: Normal heart rate noted  Respiratory: Normal respiratory effort, no problems with respiration noted  Abdomen: Soft, gravid, appropriate for gestational age. Pain/Pressure: Present     Pelvic:  Cervical exam deferred        Extremities: Normal range of motion.  Edema: Trace  Mental Status: Normal mood and affect. Normal behavior. Normal judgment and thought content.   Assessment and Plan:  Pregnancy: G3P2002 at 339w5d  1. Encounter for supervision of normal pregnancy in multigravida Pt labs reviewed from last visit. GBS is negative Discussed with Pt that I have no medical reason to put her out of work, but if her job will except FMLA for Pregnancy only I would be happy to fill out the paper work. She is going to check with them.   Term labor symptoms and general obstetric precautions including but not limited to vaginal bleeding,  contractions, leaking of fluid and fetal movement were reviewed in detail with the patient. Please refer to After Visit Summary for other counseling recommendations.  Return in about 1 week (around 06/26/2016) for LOB.  Lorne SkeensNicholas Michael Terena Bohan, MD

## 2016-06-25 ENCOUNTER — Ambulatory Visit (INDEPENDENT_AMBULATORY_CARE_PROVIDER_SITE_OTHER): Payer: Medicaid Other | Admitting: Obstetrics and Gynecology

## 2016-06-25 VITALS — BP 110/70 | HR 94 | Wt 170.0 lb

## 2016-06-25 DIAGNOSIS — O26899 Other specified pregnancy related conditions, unspecified trimester: Secondary | ICD-10-CM | POA: Insufficient documentation

## 2016-06-25 DIAGNOSIS — Z3480 Encounter for supervision of other normal pregnancy, unspecified trimester: Secondary | ICD-10-CM

## 2016-06-25 DIAGNOSIS — Z3483 Encounter for supervision of other normal pregnancy, third trimester: Secondary | ICD-10-CM

## 2016-06-25 DIAGNOSIS — R102 Pelvic and perineal pain: Secondary | ICD-10-CM

## 2016-06-25 MED ORDER — CYCLOBENZAPRINE HCL 10 MG PO TABS: 10.0000 mg | ORAL_TABLET | Freq: Three times a day (TID) | ORAL | 0 refills | Status: DC | PRN

## 2016-06-25 NOTE — Progress Notes (Signed)
   PRENATAL VISIT NOTE  Subjective:  Cristina Burke is a 25 y.o. G3P2002 at 4017w4d being seen today for ongoing prenatal care.  She is currently monitored for the following issues for this low-risk pregnancy and has Encounter for supervision of normal pregnancy in multigravida on her problem list.  Patient reports right hip pain that shoots into her pelvis. The pain is worse when she is working and on her feet alot. .  Contractions: Irregular. Vag. Bleeding: None.  Movement: Present. Denies leaking of fluid.   The following portions of the patient's history were reviewed and updated as appropriate: allergies, current medications, past family history, past medical history, past social history, past surgical history and problem list. Problem list updated.  Objective:   Vitals:   06/25/16 0755  BP: 110/70  Pulse: 94  Weight: 170 lb (77.1 kg)    Fetal Status: Fetal Heart Rate (bpm): 142   Movement: Present     General:  Alert, oriented and cooperative. Patient is in no acute distress.  Skin: Skin is warm and dry. No rash noted.   Cardiovascular: Normal heart rate noted  Respiratory: Normal respiratory effort, no problems with respiration noted  Abdomen: Soft, gravid, appropriate for gestational age. Pain/Pressure: Present     Pelvic:  Cervical exam performed Dilation: 1 Effacement (%): Thick Station: -3  Extremities: Normal range of motion.  Edema: Mild pitting, slight indentation  Mental Status: Normal mood and affect. Normal behavior. Normal judgment and thought content.   Assessment and Plan:  Pregnancy: G3P2002 at 1117w4d  1. Encounter for supervision of normal pregnancy in multigravida - GBS negative  - GC negative   2. Pain of round ligament affecting pregnancy, antepartum - Flexeril 10 mg as needed RX - Work note given to be off of work this week. Patient encouraged to bring FMLA paper work back today to have filled out for her to return to her job. Patient is agreeable.  -  discussed normalcy of pregnancy, encouraged pregnancy support belt.   Term labor symptoms and general obstetric precautions including but not limited to vaginal bleeding, contractions, leaking of fluid and fetal movement were reviewed in detail with the patient. Please refer to After Visit Summary for other counseling recommendations.  No Follow-up on file.  Duane LopeJennifer I Dimetrius Montfort, NP

## 2016-06-25 NOTE — Patient Instructions (Addendum)
Round Ligament Pain During Pregnancy   Round ligament pain is a sharp pain or jabbing feeling often felt in the lower belly or groin area on one or both sides. It is one of the most common complaints during pregnancy and is considered a normal part of pregnancy. It is most often felt during the second trimester.   Here is what you need to know about round ligament pain, including some tips to help you feel better.   Causes of Round Ligament Pain:    Several thick ligaments surround and support your womb (uterus) as it grows during pregnancy. One of them is called the round ligament.   The round ligament connects the front part of the womb to your groin, the area where your legs attach to your pelvis. The round ligament normally tightens and relaxes slowly.   As your baby and womb grow, the round ligament stretches. That makes it more likely to become strained.   Sudden movements can cause the ligament to tighten quickly, like a rubber band snapping. This causes a sudden and quick jabbing feeling.   Symptoms of Round Ligament Pain   Round ligament pain can be concerning and uncomfortable. But it is considered normal as your body changes during pregnancy.   The symptoms of round ligament pain include a sharp, sudden spasm in the belly. It usually affects the right side, but it may happen on both sides. The pain only lasts a few seconds.   Exercise may cause the pain, as will rapid movements such as:   sneezing  coughing  laughing  rolling over in bed  standing up too quickly   Treatment of Round Ligament Pain   Here are some tips that may help reduce your discomfort:   Pain relief. Take over-the-counter acetaminophen for pain, if necessary. Ask your doctor if this is OK.   Exercise. Get plenty of exercise to keep your stomach (core) muscles strong. Doing stretching exercises or prenatal yoga can be helpful. Ask your doctor which exercises are safe for you and your baby.   A  helpful exercise involves putting your hands and knees on the floor, lowering your head, and pushing your backside into the air.   Avoid sudden movements. Change positions slowly (such as standing up or sitting down) to avoid sudden movements that may cause stretching and pain.   Flex your hips. Bend and flex your hips before you cough, sneeze, or laugh to avoid pulling on the ligaments.   Apply warmth. A heating pad or warm bath may be helpful. Ask your doctor if this is OK. Extreme heat can be dangerous to the baby.   You should try to modify your daily activity level and avoid positions that may worsen the condition.   When to Call the Doctor/Midwife   Always tell your doctor or midwife about any type of pain you have during pregnancy. Round ligament pain is quick and doesn't last long.   Call your health care provider immediately if you have:   severe pain  fever  chills  pain on urination  difficulty walking   Belly pain during pregnancy can be due to many different causes. It is important for your doctor to rule out more serious conditions, including pregnancy complications such as placenta abruption or non-pregnancy illnesses such as:   inguinal hernia  appendicitis  stomach, liver, and kidney problems  Preterm labor pains may sometimes be mistaken for round ligament pain.              Constipation, Adult Constipation is when a person has fewer than three bowel movements a week, has difficulty having a bowel movement, or has stools that are dry, hard, or larger than normal. As people grow older, constipation is more common. A low-fiber diet, not taking in enough fluids, and taking certain medicines may make constipation worse.  CAUSES   Certain medicines, such as antidepressants, pain medicine, iron supplements, antacids, and water pills.   Certain diseases, such as diabetes, irritable bowel syndrome (IBS), thyroid disease, or depression.   Not drinking enough water.    Not eating enough fiber-rich foods.   Stress or travel.   Lack of physical activity or exercise.   Ignoring the urge to have a bowel movement.   Using laxatives too much.  SIGNS AND SYMPTOMS   Having fewer than three bowel movements a week.   Straining to have a bowel movement.   Having stools that are hard, dry, or larger than normal.   Feeling full or bloated.   Pain in the lower abdomen.   Not feeling relief after having a bowel movement.  DIAGNOSIS  Your health care provider will take a medical history and perform a physical exam. Further testing may be done for severe constipation. Some tests may include:  A barium enema X-ray to examine your rectum, colon, and, sometimes, your small intestine.   A sigmoidoscopy to examine your lower colon.   A colonoscopy to examine your entire colon. TREATMENT  Treatment will depend on the severity of your constipation and what is causing it. Some dietary treatments include drinking more fluids and eating more fiber-rich foods. Lifestyle treatments may include regular exercise. If these diet and lifestyle recommendations do not help, your health care provider may recommend taking over-the-counter laxative medicines to help you have bowel movements. Prescription medicines may be prescribed if over-the-counter medicines do not work.  HOME CARE INSTRUCTIONS   Eat foods that have a lot of fiber, such as fruits, vegetables, whole grains, and beans.  Limit foods high in fat and processed sugars, such as french fries, hamburgers, cookies, candies, and soda.   A fiber supplement may be added to your diet if you cannot get enough fiber from foods.   Drink enough fluids to keep your urine clear or pale yellow.   Exercise regularly or as directed by your health care provider.   Go to the restroom when you have the urge to go. Do not hold it.   Only take over-the-counter or prescription medicines as directed by your  health care provider. Do not take other medicines for constipation without talking to your health care provider first.  SEEK IMMEDIATE MEDICAL CARE IF:   You have bright red blood in your stool.   Your constipation lasts for more than 4 days or gets worse.   You have abdominal or rectal pain.   You have thin, pencil-like stools.   You have unexplained weight loss. MAKE SURE YOU:   Understand these instructions.  Will watch your condition.  Will get help right away if you are not doing well or get worse.   This information is not intended to replace advice given to you by your health care provider. Make sure you discuss any questions you have with your health care provider.   Document Released: 05/09/2004 Document Revised: 09/01/2014    Miralax and colace as directed on the bottle       Document Reviewed: 05/23/2013 Elsevier Interactive Patient Education [employer]2016 Elsevier Inc.

## 2016-07-01 ENCOUNTER — Encounter: Payer: Self-pay | Admitting: Advanced Practice Midwife

## 2016-07-02 ENCOUNTER — Inpatient Hospital Stay (HOSPITAL_COMMUNITY)
Admission: AD | Admit: 2016-07-02 | Discharge: 2016-07-04 | DRG: 767 | Disposition: A | Discharge status: Home / Self Care | Payer: Medicaid Other | Source: Ambulatory Visit | Attending: Family Medicine | Admitting: Family Medicine

## 2016-07-02 ENCOUNTER — Encounter (HOSPITAL_COMMUNITY): Payer: Self-pay | Admitting: *Deleted

## 2016-07-02 DIAGNOSIS — Z833 Family history of diabetes mellitus: Secondary | ICD-10-CM

## 2016-07-02 DIAGNOSIS — R102 Pelvic and perineal pain: Secondary | ICD-10-CM

## 2016-07-02 DIAGNOSIS — Z302 Encounter for sterilization: Secondary | ICD-10-CM

## 2016-07-02 DIAGNOSIS — Z3A39 39 weeks gestation of pregnancy: Secondary | ICD-10-CM

## 2016-07-02 DIAGNOSIS — Z3403 Encounter for supervision of normal first pregnancy, third trimester: Secondary | ICD-10-CM | POA: Diagnosis present

## 2016-07-02 DIAGNOSIS — Z3480 Encounter for supervision of other normal pregnancy, unspecified trimester: Secondary | ICD-10-CM

## 2016-07-02 DIAGNOSIS — Z8249 Family history of ischemic heart disease and other diseases of the circulatory system: Secondary | ICD-10-CM

## 2016-07-02 DIAGNOSIS — O26899 Other specified pregnancy related conditions, unspecified trimester: Secondary | ICD-10-CM

## 2016-07-02 HISTORY — DX: Other specified health status: Z78.9

## 2016-07-02 LAB — CBC
HEMATOCRIT: 34.7 % — AB (ref 36.0–46.0)
HEMOGLOBIN: 11.3 g/dL — AB (ref 12.0–15.0)
MCH: 25 pg — AB (ref 26.0–34.0)
MCHC: 32.6 g/dL (ref 30.0–36.0)
MCV: 76.8 fL — AB (ref 78.0–100.0)
Platelets: 225 10*3/uL (ref 150–400)
RBC: 4.52 MIL/uL (ref 3.87–5.11)
RDW: 15 % (ref 11.5–15.5)
WBC: 9 10*3/uL (ref 4.0–10.5)

## 2016-07-02 LAB — TYPE AND SCREEN
ABO/RH(D): O POS
Antibody Screen: NEGATIVE

## 2016-07-02 LAB — RPR: RPR Ser Ql: NONREACTIVE

## 2016-07-02 MED ORDER — SENNOSIDES-DOCUSATE SODIUM 8.6-50 MG PO TABS
2.0000 | ORAL_TABLET | ORAL | Status: DC
  Administered 2016-07-03 – 2016-07-04 (×2): 2 via ORAL
  Filled 2016-07-02 (×2): qty 2

## 2016-07-02 MED ORDER — METOCLOPRAMIDE HCL 10 MG PO TABS
10.0000 mg | ORAL_TABLET | Freq: Once | ORAL | Status: AC
  Administered 2016-07-03: 10 mg via ORAL
  Filled 2016-07-02: qty 1

## 2016-07-02 MED ORDER — LACTATED RINGERS IV SOLN
INTRAVENOUS | Status: DC
  Administered 2016-07-02: 09:00:00 via INTRAVENOUS

## 2016-07-02 MED ORDER — ONDANSETRON HCL 4 MG/2ML IJ SOLN: 4.0000 mg | Freq: Four times a day (QID) | INTRAMUSCULAR | Status: DC | PRN

## 2016-07-02 MED ORDER — ACETAMINOPHEN 325 MG PO TABS
650.0000 mg | ORAL_TABLET | ORAL | Status: DC | PRN
  Administered 2016-07-02 – 2016-07-04 (×3): 650 mg via ORAL
  Filled 2016-07-02 (×3): qty 2

## 2016-07-02 MED ORDER — ONDANSETRON HCL 4 MG PO TABS: 4.0000 mg | ORAL_TABLET | ORAL | Status: DC | PRN

## 2016-07-02 MED ORDER — BENZOCAINE-MENTHOL 20-0.5 % EX AERO
1.0000 "application " | INHALATION_SPRAY | CUTANEOUS | Status: DC | PRN
  Filled 2016-07-02: qty 56

## 2016-07-02 MED ORDER — ACETAMINOPHEN 325 MG PO TABS: 650.0000 mg | ORAL_TABLET | ORAL | Status: DC | PRN

## 2016-07-02 MED ORDER — FENTANYL CITRATE (PF) 100 MCG/2ML IJ SOLN: 100.0000 ug | INTRAMUSCULAR | Status: DC | PRN

## 2016-07-02 MED ORDER — LACTATED RINGERS IV SOLN
INTRAVENOUS | Status: DC
  Administered 2016-07-03: 14:00:00 via INTRAVENOUS
  Administered 2016-07-03: 20 mL/h via INTRAVENOUS

## 2016-07-02 MED ORDER — OXYCODONE-ACETAMINOPHEN 5-325 MG PO TABS
2.0000 | ORAL_TABLET | ORAL | Status: DC | PRN
  Administered 2016-07-02: 2 via ORAL
  Filled 2016-07-02 (×2): qty 2

## 2016-07-02 MED ORDER — SIMETHICONE 80 MG PO CHEW: 80.0000 mg | CHEWABLE_TABLET | ORAL | Status: DC | PRN

## 2016-07-02 MED ORDER — LACTATED RINGERS IV SOLN: 500.0000 mL | INTRAVENOUS | Status: DC | PRN

## 2016-07-02 MED ORDER — SOD CITRATE-CITRIC ACID 500-334 MG/5ML PO SOLN: 30.0000 mL | ORAL | Status: DC | PRN

## 2016-07-02 MED ORDER — COCONUT OIL OIL: 1.0000 "application " | TOPICAL_OIL | Status: DC | PRN

## 2016-07-02 MED ORDER — LIDOCAINE HCL (PF) 1 % IJ SOLN
30.0000 mL | INTRAMUSCULAR | Status: DC | PRN
Start: 2016-07-02 — End: 2016-07-02
  Filled 2016-07-02: qty 30

## 2016-07-02 MED ORDER — ONDANSETRON HCL 4 MG/2ML IJ SOLN: 4.0000 mg | INTRAMUSCULAR | Status: DC | PRN

## 2016-07-02 MED ORDER — OXYTOCIN BOLUS FROM INFUSION
500.0000 mL | Freq: Once | INTRAVENOUS | Status: DC
  Administered 2016-07-02: 500 mL via INTRAVENOUS

## 2016-07-02 MED ORDER — TETANUS-DIPHTH-ACELL PERTUSSIS 5-2.5-18.5 LF-MCG/0.5 IM SUSP: 0.5000 mL | Freq: Once | INTRAMUSCULAR | Status: DC

## 2016-07-02 MED ORDER — PRENATAL MULTIVITAMIN CH
1.0000 | ORAL_TABLET | Freq: Every day | ORAL | Status: DC
  Administered 2016-07-04: 1 via ORAL
  Filled 2016-07-02 (×3): qty 1

## 2016-07-02 MED ORDER — WITCH HAZEL-GLYCERIN EX PADS: 1.0000 "application " | MEDICATED_PAD | CUTANEOUS | Status: DC | PRN

## 2016-07-02 MED ORDER — ZOLPIDEM TARTRATE 5 MG PO TABS: 5.0000 mg | ORAL_TABLET | Freq: Every evening | ORAL | Status: DC | PRN

## 2016-07-02 MED ORDER — IBUPROFEN 600 MG PO TABS
600.0000 mg | ORAL_TABLET | Freq: Four times a day (QID) | ORAL | Status: DC
  Administered 2016-07-02 – 2016-07-04 (×8): 600 mg via ORAL
  Filled 2016-07-02 (×8): qty 1

## 2016-07-02 MED ORDER — OXYTOCIN 40 UNITS IN LACTATED RINGERS INFUSION - SIMPLE MED
2.5000 [IU]/h | INTRAVENOUS | Status: DC
  Filled 2016-07-02: qty 1000

## 2016-07-02 MED ORDER — FLEET ENEMA 7-19 GM/118ML RE ENEM: 1.0000 | ENEMA | RECTAL | Status: DC | PRN

## 2016-07-02 MED ORDER — FAMOTIDINE 20 MG PO TABS
40.0000 mg | ORAL_TABLET | Freq: Once | ORAL | Status: AC
  Administered 2016-07-03: 40 mg via ORAL
  Filled 2016-07-02: qty 2

## 2016-07-02 MED ORDER — OXYCODONE-ACETAMINOPHEN 5-325 MG PO TABS: 1.0000 | ORAL_TABLET | ORAL | Status: DC | PRN

## 2016-07-02 MED ORDER — DIPHENHYDRAMINE HCL 25 MG PO CAPS: 25.0000 mg | ORAL_CAPSULE | Freq: Four times a day (QID) | ORAL | Status: DC | PRN

## 2016-07-02 MED ORDER — DIBUCAINE 1 % RE OINT: 1.0000 "application " | TOPICAL_OINTMENT | RECTAL | Status: DC | PRN

## 2016-07-02 NOTE — Lactation Note (Signed)
This note was copied from a baby's chart. Lactation Consultation Note  Patient Name: Cristina Doreen BeamJennifer Grzywacz [id number]'QToday's Date: 07/02/2016 Reason for consult: Initial assessment   Initial assessment with mom of 7 hour old infant. Infant weight 8 lb 2.7 oz. Mom did not latch her first 2 children and pumped and bottle fed them breast milk by choice. Infant with 4 BF for 10 minutes since birth.  Infant was latched and actively feeding when I went into the room. Mom was sitting up and leaning slightly forward to feed infant. Offered to assist her in leaning back and latching infant, she said she was comfortable the way she was.  Enc mom to feed infant 8-12 x in 24 hours. Mom reports she plans to give formula, enc her to offer breast first and then to give formula after. She has not asked for formula and has been latching infant for feedings since birth. DEBP was [facility], mom reports she has not started pumping.   Feeding log given for home use. BF Resources and [facility] Broch[facility] given, mom informed of IP/OP Services, BF Support Groups and LC phone #. Enc mom to call out to desk for feeding assistance as needed. Mom is a [facility] client and reports she does not have a pump at home. Follow up tomorrow and prn.     Maternal Data Formula Feeding for Exclusion: Yes Reason for exclusion: Mother's choice to formula and breast feed on admission Does the patient have breastfeeding experience prior to this delivery?: Yes  Feeding Feeding Type: Breast Fed Length of feed: 10 min (Still feeding when I left the room)  LATCH Score/Interventions Latch: Grasps breast easily, tongue down, lips flanged, rhythmical sucking.  Audible Swallowing: A few with stimulation  Type of Nipple: Everted at rest and after stimulation  Comfort (Breast/Nipple): Soft / non-tender     Hold (Positioning): No assistance needed to correctly position infant at breast.  LATCH Score: 9  Lactation Tools Discussed/Used WIC Program:  Yes   Consult Status Consult Status: Follow-up Date: 07/03/16 Follow-up type: In-patient    Cristina FloodSharon S Burke 07/02/2016, 5:07 PM

## 2016-07-02 NOTE — MAU Note (Signed)
Started contracting around 0630.  Every 2-3 min. No bleeding or leaking. Has not been dilated. No problems with preg

## 2016-07-02 NOTE — Anesthesia Pain Management Evaluation Note (Signed)
  CRNA Pain Management Visit Note  Patient: Cristina Burke, 25 y.o., female  "Hello I am a [facility]. We have an anesthesia team available at all times to provide care throughout the hospital, including epidural management and anesthesia for C-section. I don't know your plan for the delivery whether it a natural birth, water birth, IV sedation, nitrous supplementation, doula or epidural, but we want to meet your pain goals."   1.Was your pain managed to your expectations on prior hospitalizations?   Yes   2.What is your expectation for pain management during this hospitalization?     Labor support without medications  3.How can we help you reach that goal? See above  Record the patient's initial score and the patient's pain goal.   Pain: 10  Pain Goal: 10 [facility] wants you to be able to say your pain was always managed very well.  Cristina Burke 07/02/2016

## 2016-07-02 NOTE — H&P (Signed)
LABOR AND DELIVERY ADMISSION HISTORY AND PHYSICAL NOTE  Cristina Burke is a 25 y.o. female 533P2002 with IUP at 2854w4d by ultrasound presenting for SOL.   She reports positive fetal movement. She denies leakage of fluid or vaginal bleeding.  Prenatal History/Complications:  Past Medical History: Past Medical History:  Diagnosis Date  . Medical history non-contributory   . No pertinent past medical history     Past Surgical History: Past Surgical History:  Procedure Laterality Date  . NO PAST SURGERIES      Obstetrical History: OB History    Gravida Para Term Preterm AB Living   3 2 2     2    SAB TAB Ectopic Multiple Live Births           2      Social History: Social History   Social History  . Marital status: Single    Spouse name: N/A  . Number of children: N/A  . Years of education: N/A   Social History Main Topics  . Smoking status: Never Smoker  . Smokeless tobacco: Never Used  . Alcohol use No  . Drug use: No  . Sexual activity: Yes    Birth control/ protection: None   Other Topics Concern  . None   Social History Narrative  . None    Family History: Family History  Problem Relation Age of Onset  . Hypertension Mother   . Diabetes Mother   . Diabetes Brother   . Diabetes Sister     Allergies: No Known Allergies  Prescriptions Prior to Admission  Medication Sig Dispense Refill Last Dose  . cyclobenzaprine (FLEXERIL) 10 MG tablet Take 1 tablet (10 mg total) by mouth every 8 (eight) hours as needed for muscle spasms. 30 tablet 0   . Prenatal Vit-Fe Fumarate-FA (PRENATAL MULTIVITAMIN) TABS Take 1 tablet by mouth daily. Reported on 01/16/2016   Taking     Review of Systems   All systems reviewed and negative except as stated in HPI  Blood pressure 129/77, pulse 90, temperature 98.3 F (36.8 C), temperature source Oral, resp. rate 18, last menstrual period 09/18/2015, unknown if currently breastfeeding. General appearance: alert,  cooperative, appears stated age, moderate distress and severe distress Heart: regular rate and rhythm with intact distal pulses Abdomen: soft, non-tender; bowel sounds normal Extremities: No calf swelling or tenderness Fetal monitoring: Category 1 Uterine activity: Q861min Dilation: 8.5 Effacement (%): 100 Station: -2 Exam by:: raney gagnon rnc   Prenatal labs: ABO, Rh: O/POS/-- (05/24 0910) Antibody: NEG (05/24 0910) Rubella: !Error!  RPR: NON REAC (08/18 1016)  HBsAg: NEGATIVE (05/24 0910)  HIV: NONREACTIVE (08/18 1016)  GBS:    1 hr Glucola: Failed, but passed 3 hour Genetic screening:  Negative Anatomy US: Normal  Prenatal Transfer Tool  Maternal Diabetes: No Genetic Screening: Normal Maternal Ultrasounds/Referrals: Normal Fetal Ultrasounds or other Referrals:  None Maternal Substance Abuse:  No Significant Maternal Medications:  None Significant Maternal Lab Results: None  No results found for this or any previous visit (from the past 24 hour(s)).  Patient Active Problem List   Diagnosis Date Noted  . Normal labor 07/02/2016  . Pain of round ligament affecting pregnancy, antepartum 06/25/2016  . Encounter for supervision of normal pregnancy in multigravida 12/19/2015    Assessment: Cristina Burke is a 25 y.o. G3P2002 at 8754w4d here for SOL  #Labor:Monitor natural progression as patient is admitted in active labor #Pain: No pain control at this time 2/2 inadequate time from admission #FWB:  Category 1 #ID:  GBS negative #MOF: Breast #MOC: BTL #Circ:  NA  Josue D Santos 07/02/2016, 8:51 AM   OB FELLOW HISTORY AND PHYSICAL ATTESTATION  I have seen and examined this patient; I agree with above documentation in the resident's note.    Cristina MowElizabeth Thoma Paulsen, DO OB Fellow 07/02/2016, 8:58 AM

## 2016-07-03 ENCOUNTER — Encounter (HOSPITAL_COMMUNITY)
Admission: AD | Disposition: A | Discharge status: Home / Self Care | Payer: Self-pay | Source: Ambulatory Visit | Attending: Family Medicine

## 2016-07-03 ENCOUNTER — Encounter (HOSPITAL_COMMUNITY): Payer: Self-pay | Admitting: Anesthesiology

## 2016-07-03 ENCOUNTER — Inpatient Hospital Stay (HOSPITAL_COMMUNITY): Payer: Medicaid Other | Admitting: Anesthesiology

## 2016-07-03 DIAGNOSIS — Z302 Encounter for sterilization: Secondary | ICD-10-CM | POA: Diagnosis not present

## 2016-07-03 HISTORY — PX: TUBAL LIGATION: SHX77

## 2016-07-03 SURGERY — LIGATION, FALLOPIAN TUBE, POSTPARTUM
Anesthesia: Monitor Anesthesia Care | Site: Abdomen | Laterality: Bilateral

## 2016-07-03 MED ORDER — SCOPOLAMINE 1 MG/3DAYS TD PT72
MEDICATED_PATCH | TRANSDERMAL | Status: AC
  Filled 2016-07-03: qty 1

## 2016-07-03 MED ORDER — FENTANYL CITRATE (PF) 100 MCG/2ML IJ SOLN
INTRAMUSCULAR | Status: AC
  Filled 2016-07-03: qty 2

## 2016-07-03 MED ORDER — ONDANSETRON HCL 4 MG/2ML IJ SOLN: 4.0000 mg | Freq: Four times a day (QID) | INTRAMUSCULAR | Status: DC | PRN

## 2016-07-03 MED ORDER — FENTANYL CITRATE (PF) 100 MCG/2ML IJ SOLN
25.0000 ug | INTRAMUSCULAR | Status: DC | PRN
  Administered 2016-07-03: 50 ug via INTRAVENOUS

## 2016-07-03 MED ORDER — SCOPOLAMINE 1 MG/3DAYS TD PT72
1.0000 | MEDICATED_PATCH | TRANSDERMAL | Status: DC
  Administered 2016-07-03: 1.5 mg via TRANSDERMAL

## 2016-07-03 MED ORDER — MIDAZOLAM HCL 5 MG/5ML IJ SOLN
INTRAMUSCULAR | Status: DC | PRN
  Administered 2016-07-03 (×2): 1 mg via INTRAVENOUS

## 2016-07-03 MED ORDER — OXYCODONE HCL 5 MG/5ML PO SOLN: 5.0000 mg | Freq: Once | ORAL | Status: DC | PRN

## 2016-07-03 MED ORDER — FENTANYL CITRATE (PF) 100 MCG/2ML IJ SOLN
INTRAMUSCULAR | Status: DC | PRN
  Administered 2016-07-03 (×2): 50 ug via INTRAVENOUS

## 2016-07-03 MED ORDER — ONDANSETRON HCL 4 MG/2ML IJ SOLN
INTRAMUSCULAR | Status: DC | PRN
  Administered 2016-07-03: 4 mg via INTRAVENOUS

## 2016-07-03 MED ORDER — MIDAZOLAM HCL 2 MG/2ML IJ SOLN
INTRAMUSCULAR | Status: AC
  Filled 2016-07-03: qty 2

## 2016-07-03 MED ORDER — ONDANSETRON HCL 4 MG/2ML IJ SOLN
INTRAMUSCULAR | Status: AC
  Filled 2016-07-03: qty 2

## 2016-07-03 MED ORDER — OXYCODONE HCL 5 MG PO TABS: 5.0000 mg | ORAL_TABLET | Freq: Once | ORAL | Status: DC | PRN

## 2016-07-03 MED ORDER — BUPIVACAINE HCL (PF) 0.25 % IJ SOLN
INTRAMUSCULAR | Status: DC | PRN
  Administered 2016-07-03: 7 mL

## 2016-07-03 SURGICAL SUPPLY — 25 items
BLADE SURG 11 STRL SS (BLADE) ×3 IMPLANT
CLIP FILSHIE TUBAL LIGA STRL (Clip) ×6 IMPLANT
CLOTH BEACON ORANGE TIMEOUT ST (SAFETY) ×3 IMPLANT
DRSG OPSITE POSTOP 3X4 (GAUZE/BANDAGES/DRESSINGS) ×3 IMPLANT
DURAPREP 26ML APPLICATOR (WOUND CARE) ×3 IMPLANT
GLOVE BIO SURGEON STRL SZ 6.5 (GLOVE) ×2 IMPLANT
GLOVE BIO SURGEONS STRL SZ 6.5 (GLOVE) ×1
GLOVE BIOGEL PI IND STRL 7.0 (GLOVE) ×2 IMPLANT
GLOVE BIOGEL PI INDICATOR 7.0 (GLOVE) ×4
GOWN STRL REUS W/TWL LRG LVL3 (GOWN DISPOSABLE) ×6 IMPLANT
NEEDLE HYPO 22GX1.5 SAFETY (NEEDLE) ×3 IMPLANT
NS IRRIG 1000ML POUR BTL (IV SOLUTION) ×3 IMPLANT
PACK ABDOMINAL MINOR (CUSTOM PROCEDURE TRAY) ×3 IMPLANT
PROTECTOR NERVE ULNAR (MISCELLANEOUS) ×3 IMPLANT
SPONGE LAP 4X18 X RAY DECT (DISPOSABLE) IMPLANT
SUT VIC AB 0 CT1 27 (SUTURE) ×2
SUT VIC AB 0 CT1 27XBRD ANBCTR (SUTURE) ×1 IMPLANT
SUT VICRYL 4-0 PS2 18IN ABS (SUTURE) ×3 IMPLANT
SYR CONTROL 10ML LL (SYRINGE) ×3 IMPLANT
TOWEL OR 17X24 6PK STRL BLUE (TOWEL DISPOSABLE) ×6 IMPLANT
TRAY FOLEY CATH SILVER 16FR (SET/KITS/TRAYS/PACK) ×3 IMPLANT
TUBING NON-CON 1/4 X 20 CONN (TUBING) ×2 IMPLANT
TUBING NON-CON 1/4 X 20' CONN (TUBING) ×1
WATER STERILE IRR 1000ML POUR (IV SOLUTION) ×3 IMPLANT
YANKAUER SUCT BULB TIP NO VENT (SUCTIONS) ×3 IMPLANT

## 2016-07-03 NOTE — Progress Notes (Addendum)
  POSTPARTUM PROGRESS NOTE  Post Partum Day 1 Subjective:  Cristina Burke is a 25 y.o. [id number] 374w4d s/p SVD.  No acute events overnight.  Pt denies problems with ambulating, voiding or po intake.  She denies nausea or vomiting.  Pain is well controlled.  She has had flatus. She has not had bowel movement.  Lochia Small.   Objective: Blood pressure 125/69, pulse 67, temperature 98.4 F (36.9 C), temperature source Oral, resp. rate 16, height 5\' 1"  (1.549 m), weight 77.1 kg (170 lb), last menstrual period 09/18/2015, unknown if currently breastfeeding.  Physical Exam:  General: alert, cooperative and no distress Lochia:normal flow Chest: CTAB Heart: RRR no m/r/g Abdomen: +BS, soft, nontender,  Uterine Fundus: firm, below level of umbilicus DVT Evaluation: No calf swelling or tenderness Extremities: without edema   Recent Labs  07/02/16 0845  HGB 11.3*  HCT 34.7*    Assessment/Plan:  ASSESSMENT: Cristina Burke is a 25 y.o. Y7W2956G3P3003 1474w4d s/p SVD  Plan for discharge tomorrow and Contraception BTL this afternoon   LOS: 1 day   Tillman Sersngela C Riccio, DO 07/03/2016, 7:33 AM  25 y.o. [id number] with undesired fertility,status post vaginal delivery, desires permanent sterilization. Risks and benefits of procedure discussed with patient including permanence of method, bleeding, infection, injury to surrounding organs and need for additional procedures. Risk failure of 0.5-1% with increased risk of ectopic gestation if pregnancy occurs was also discussed with patient. Patient verbalized understanding and all questions were answered.  Currie ParisJames G. Debroah LoopArnold MD 07/03/2016 1:22 PM

## 2016-07-03 NOTE — Anesthesia Postprocedure Evaluation (Signed)
Anesthesia Post Note  Patient: Cristina Burke  Procedure(s) Performed: Procedure(s) (LRB): POST PARTUM TUBAL LIGATION (Bilateral)  Patient location during evaluation: PACU Anesthesia Type: Spinal Level of consciousness: awake Pain management: satisfactory to patient Vital Signs Assessment: post-procedure vital signs reviewed and stable Respiratory status: spontaneous breathing Cardiovascular status: blood pressure returned to baseline Postop Assessment: no headache and spinal receding Anesthetic complications: no     Last Vitals:  Vitals:   07/03/16 1445 07/03/16 1500  BP: 105/75 107/73  Pulse:  60  Resp:  12  Temp:      Last Pain:  Vitals:   07/03/16 1500  TempSrc:   PainSc: 0-No pain   Pain Goal: Patients Stated Pain Goal: 0 (07/02/16 2248)               Cristela BlueJACKSON,Cristina Burke

## 2016-07-03 NOTE — Anesthesia Postprocedure Evaluation (Signed)
Anesthesia Post Note  Patient: Cristina BeamJennifer Burke  Procedure(s) Performed: Procedure(s) (LRB): POST PARTUM TUBAL LIGATION (Bilateral)  Patient location during evaluation: Mother Baby Anesthesia Type: Spinal Level of consciousness: awake Pain management: satisfactory to patient Vital Signs Assessment: post-procedure vital signs reviewed and stable Respiratory status: spontaneous breathing Cardiovascular status: stable Anesthetic complications: no     Last Vitals:  Vitals:   07/03/16 1720 07/03/16 1745  BP: 120/80 126/75  Pulse: 70 77  Resp: 14   Temp:  36.8 C    Last Pain:  Vitals:   07/03/16 1745  TempSrc:   PainSc: 0-No pain   Pain Goal: Patients Stated Pain Goal: 0 (07/02/16 2248)               [name],[name]

## 2016-07-03 NOTE — Anesthesia Procedure Notes (Signed)
Spinal  Patient location during procedure: OR Preanesthetic Checklist Completed: patient identified, site marked, surgical consent, pre-op evaluation, timeout performed, IV checked, risks and benefits discussed and monitors and equipment checked Spinal Block Patient position: sitting Prep: DuraPrep Patient monitoring: heart rate, cardiac monitor, continuous pulse ox and blood pressure Approach: midline Location: L3-4 Injection technique: single-shot Needle Needle type: Sprotte  Needle gauge: 24 G Needle length: 9 cm Assessment Sensory level: T4 Additional Notes Spinal Dosage in OR  Bupivicaine ml       1.2

## 2016-07-03 NOTE — Transfer of Care (Signed)
Immediate Anesthesia Transfer of Care Note  Patient: Cristina Burke  Procedure(s) Performed: Procedure(s): POST PARTUM TUBAL LIGATION (Bilateral)  Patient Location: PACU  Anesthesia Type:Spinal  Level of Consciousness: awake, alert  and oriented  Airway & Oxygen Therapy: Patient Spontanous Breathing  Post-op Assessment: Report given to RN and Post -op Vital signs reviewed and stable  Post vital signs: Reviewed and stable  Last Vitals:  07/03/16 1435 Bp 98/68 HR 69  Resp 12 O2sat 100% Temp 97.8  Last Pain:  Vitals:   07/03/16 1219  TempSrc: Oral  PainSc:       Patients Stated Pain Goal: 0 (07/02/16 2248)  Complications: No apparent anesthesia complications

## 2016-07-03 NOTE — Anesthesia Preprocedure Evaluation (Signed)
Anesthesia Evaluation  Patient identified by MRN, date of birth, ID band Patient awake    Reviewed: Allergy & Precautions, H&P , NPO status , Patient's Chart, lab work & pertinent test results  Airway Mallampati: II   Neck ROM: full    Dental   Pulmonary neg pulmonary ROS,    breath sounds clear to auscultation       Cardiovascular negative cardio ROS   Rhythm:regular Rate:Normal     Neuro/Psych    GI/Hepatic   Endo/Other    Renal/GU      Musculoskeletal   Abdominal   Peds  Hematology   Anesthesia Other Findings   Reproductive/Obstetrics                             Anesthesia Physical Anesthesia Plan  ASA: I  Anesthesia Plan: MAC and Spinal   Post-op Pain Management:    Induction: Intravenous  Airway Management Planned: Simple Face Mask  Additional Equipment:   Intra-op Plan:   Post-operative Plan:   Informed Consent: I have reviewed the patients History and Physical, chart, labs and discussed the procedure including the risks, benefits and alternatives for the proposed anesthesia with the patient or authorized representative who has indicated his/her understanding and acceptance.     Plan Discussed with: CRNA, Anesthesiologist and Surgeon  Anesthesia Plan Comments:         Anesthesia Quick Evaluation

## 2016-07-03 NOTE — Progress Notes (Signed)
UR chart review completed.  

## 2016-07-03 NOTE — Addendum Note (Signed)
Addendum  created 07/03/16 1931 by Jameison Haji A Keandra Medero, CRNA   Sign clinical note    

## 2016-07-03 NOTE — Op Note (Signed)
Cristina BeamJennifer Cubero 07/02/2016 - 07/03/2016  PREOPERATIVE DIAGNOSIS:  Multiparity, undesired fertility  POSTOPERATIVE DIAGNOSIS:  Multiparity, undesired fertility  PROCEDURE:  Postpartum Bilateral Tubal Sterilization using Filshie Clips   ANESTHESIA:  Spinal  COMPLICATIONS:  None immediate.  ESTIMATED BLOOD LOSS:  Less than 20 ml.  FLUIDS: 1000 ml LR.  URINE OUTPUT:  75 ml of clear urine.  INDICATIONS: 25 y.o. [name]  with undesired fertility,status post vaginal delivery, desires permanent sterilization. Risks and benefits of procedure discussed with patient including permanence of method, bleeding, infection, injury to surrounding organs and need for additional procedures. Risk failure of 0.5-1% with increased risk of ectopic gestation if pregnancy occurs was also discussed with patient.   FINDINGS:  Normal uterus, tubes, and ovaries.  TECHNIQUE:  The patient was taken to the operating room where her epidural anesthesia was dosed up to surgical level and found to be adequate.  She was then placed in the dorsal supine position and prepped and draped in sterile fashion.  After an adequate timeout was performed, attention was turned to the patient's abdomen where a small transverse skin incision was made under the umbilical fold. The incision was taken down to the layer of fascia using the scalpel, and fascia was incised, and extended bilaterally using Mayo scissors. The peritoneum was entered in a sharp fashion. Attention was then turned to the patient's uterus, and left fallopian tube was identified and followed out to the fimbriated end.  A Filshie clip was placed on the left fallopian tube about 2 cm from the cornual attachment, with care given to incorporate the underlying mesosalpinx.  A similar process was carried out on the right side allowing for bilateral tubal sterilization.  Good hemostasis was noted overall.  Local analgesia was drizzled on both operative sites.The instruments were then  removed from the patient's abdomen and the fascial incision was repaired with 0 Vicryl, and the skin was closed with a 4-0 Vicryl subcuticular stitch. The patient tolerated the procedure well.  Sponge, lap, and needle counts were correct times two.  The patient was then taken to the recovery room awake and in stable condition.  Scheryl DarterJames Yitzhak Awan MD 07/03/2016 4:23 PM

## 2016-07-04 MED ORDER — IBUPROFEN 600 MG PO TABS: 600.0000 mg | ORAL_TABLET | Freq: Four times a day (QID) | ORAL | 0 refills | Status: DC

## 2016-07-04 MED ORDER — DOCUSATE SODIUM 100 MG PO CAPS: 100.0000 mg | ORAL_CAPSULE | Freq: Two times a day (BID) | ORAL | 2 refills | Status: DC | PRN

## 2016-07-04 MED ORDER — OXYCODONE-ACETAMINOPHEN 5-325 MG PO TABS: 1.0000 | ORAL_TABLET | ORAL | Status: DC | PRN

## 2016-07-04 MED ORDER — OXYCODONE-ACETAMINOPHEN 5-325 MG PO TABS: 1.0000 | ORAL_TABLET | ORAL | 0 refills | Status: DC | PRN

## 2016-07-04 NOTE — Discharge Summary (Signed)
OB Discharge Summary     Patient Name: Cristina Burke DOB: 1991-08-04 MRN: [id number]  Date of admission: 07/02/2016 Delivering MD: Cristina Burke   Date of discharge: 07/04/2016  Admitting diagnosis: LABOR desires sterilization  Intrauterine pregnancy: 28110w4d     Secondary diagnosis:  Active Problems:   * No active hospital problems. *  Additional problems: None     Discharge diagnosis: Term Pregnancy Delivered                                                                                                Post partum procedures:None  Augmentation: None  Complications: None  Hospital course:  Onset of Labor With Vaginal Delivery     25 y.o. yo [id number] at 25110w4d was admitted in Active Labor on 07/02/2016. Patient had an uncomplicated labor course as follows:  Membrane Rupture Time/Date: 8:40 AM ,07/02/2016   Intrapartum Procedures: Episiotomy: None [1]                                         Lacerations:  None [1]  Patient had a delivery of a Viable infant. 07/02/2016  Information for the patient's newborn:  Cristina Burke, Girl Cristina Burke [[id number]][[id number]]       Pateint had an uncomplicated postpartum course. She had a tubal ligation 07/03/16 w/out complications. See note. She is ambulating, tolerating a regular diet, passing flatus, and urinating well. Patient is discharged home in stable condition on 07/04/16.    Physical exam Vitals:   07/03/16 1745 07/03/16 2145 07/04/16 0130 07/04/16 0545  BP: 126/75 132/79 106/65 (!) 101/54  Pulse: 77 69 72 72  Resp:  18 18 18   Temp: 98.3 F (36.8 C) 98.7 F (37.1 C) 98.9 F (37.2 C) 98.7 F (37.1 C)  TempSrc:  Oral Oral Oral  SpO2: 99% 98% 97% 99%  Weight:      Height:       General: alert, cooperative and no distress Lochia: appropriate Uterine Fundus: firm Incision: Healing well with no significant drainage, Dressing is clean, dry, and intact DVT Evaluation: No evidence of DVT seen on physical exam. Labs: Lab Results  Component  Value Date   WBC 9.0 07/02/2016   HGB 11.3 (L) 07/02/2016   HCT 34.7 (L) 07/02/2016   MCV 76.8 (L) 07/02/2016   PLT 225 07/02/2016   CMP Latest Ref Rng & Units 05/15/2016  Glucose 65 - 104 mg/dL 80    Discharge instruction: per After Visit Summary and "Baby and Me Booklet".  After visit meds:    Medication List    TAKE these medications   ibuprofen 600 MG tablet Commonly known as:  ADVIL,MOTRIN Take 1 tablet (600 mg total) by mouth every 6 (six) hours.   oxyCODONE-acetaminophen 5-325 MG tablet Commonly known as:  PERCOCET/ROXICET Take 1-2 tablets by mouth every 4 (four) hours as needed for moderate pain or severe pain.       Diet: routine diet  Activity: Advance as tolerated. Pelvic rest for 6 weeks.  Outpatient follow up:6 weeks Follow up Appt:Future Appointments Date Time Provider Department Center  08/13/2016 10:20 AM Cristina Burke, CNM [facility]   Follow up Visit:No Follow-up on file.  Postpartum contraception: Tubal Ligation  Newborn Data: Live born female  Birth Weight: 8 lb 2.7 oz (3705 g) APGAR: 9, 9  Baby Feeding: Breast Disposition:home with mother   07/04/2016 Cristina KinsmanVirginia Raechel Burke, CNM

## 2016-07-04 NOTE — Discharge Instructions (Signed)
Postpartum Tubal Ligation, Care After °Refer to this sheet in the next few weeks. These instructions provide you with information about caring for yourself after your procedure. Your health care provider may also give you more specific instructions. Your treatment has been planned according to current medical practices, but problems sometimes occur. Call your health care provider if you have any problems or questions after your procedure. °WHAT TO EXPECT AFTER THE PROCEDURE °After your procedure, it is common to have: °· Sore throat. °· Soreness at the incision site. °· Mild cramping. °· Tiredness. °· Mild nausea or vomiting. °HOME CARE INSTRUCTIONS °· Rest for the remainder of the day. °· Take medicines only as directed by your health care provider. These include over-the-counter medicines and prescription medicines. Do not take aspirin, which can cause bleeding. °· Over the next few days, gradually return to your normal activities and your normal diet. °· Avoid sexual intercourse for 2 weeks or as directed by your health care provider. °· Do not drive or operate heavy machinery while taking pain medicine. °· Do not lift anything that is heavier than 5 lb (2.3 kg) for 2 weeks or as directed by your health care provider. °· Do not take baths. Take showers only. Ask your health care provider when you can start taking baths. °· Take your temperature twice each day and write it down. °· Try to have help for the first 7-10 days for your household needs. °· There are many different ways to close and cover an incision, including stitches (sutures), skin glue, and adhesive strips. Follow instructions from your health care provider about: °¨ Incision care. °¨ Bandage (dressing) changes and removal. °¨ Incision closure removal. °· Check your incision area every day for signs of infection. Watch for: °¨ Redness, swelling, or pain. °¨ Fluid, blood, or pus. °· Keep all follow-up visits as directed by your health care  provider. °SEEK MEDICAL CARE IF: °· You have redness, swelling, or increasing pain in your incision area. °· You have fluid or pus coming from your incision for longer than 1 day. °· You notice a bad smell coming from your incision or your dressing. °· The edges of your incision break open after the sutures have been removed. °· Your pain does not decrease after 2-3 days. °· You have a rash. °· You repeatedly become dizzy or light-headed. °· You have a reaction to your medicine. °· Your pain medicine is not helping. °· You are constipated. °SEEK IMMEDIATE MEDICAL CARE IF:  °· You have a fever. °· You faint. °· You have increasing pain in your abdomen. °· You have bleeding or drainage from your suture sites or your vagina after surgery. °· You have shortness of breath or have difficulty breathing. °· You have chest pain or leg pain. °· You have ongoing nausea, vomiting, or diarrhea. °  °This information is not intended to replace advice given to you by your health care provider. Make sure you discuss any questions you have with your health care provider. °  °Document Released: 02/10/2012 Document Revised: 12/26/2014 Document Reviewed: 02/10/2012 °Elsevier Interactive Patient Education ©2016 [employer] °Vaginal Delivery, Care After °Refer to this sheet in the next few weeks. These discharge instructions provide you with information on caring for yourself after delivery. Your health care provider may also give you specific instructions. Your treatment has been planned according to the most current medical practices available, but problems sometimes occur. Call your health care provider if you have any problems or questions   after you go home. °HOME CARE INSTRUCTIONS °· Take over-the-counter or prescription medicines only as directed by your health care provider or pharmacist. °· Do not drink alcohol, especially if you are breastfeeding or taking medicine to relieve pain. °· Do not chew or smoke tobacco. °· Do not use  illegal drugs. °· Continue to use good perineal care. Good perineal care includes: °¨ Wiping your perineum from front to back. °¨ Keeping your perineum clean. °· Do not use tampons or douche until your health care provider says it is okay. °· Shower, wash your hair, and take tub baths as directed by your health care provider. °· Wear a well-fitting bra that provides breast support. °· Eat healthy foods. °· Drink enough fluids to keep your urine clear or pale yellow. °· Eat high-fiber foods such as whole grain cereals and breads, brown rice, beans, and fresh fruits and vegetables every day. These foods may help prevent or relieve constipation. °· Follow your health care provider's recommendations regarding resumption of activities such as climbing stairs, driving, lifting, exercising, or traveling. °· Talk to your health care provider about resuming sexual activities. Resumption of sexual activities is dependent upon your risk of infection, your rate of healing, and your comfort and desire to resume sexual activity. °· Try to have someone help you with your household activities and your newborn for at least a few days after you leave the hospital. °· Rest as much as possible. Try to rest or take a nap when your newborn is sleeping. °· Increase your activities gradually. °· Keep all of your scheduled postpartum appointments. It is very important to keep your scheduled follow-up appointments. At these appointments, your health care provider will be checking to make sure that you are healing physically and emotionally. °SEEK MEDICAL CARE IF:  °· You are passing large clots from your vagina. Save any clots to show your health care provider. °· You have a foul smelling discharge from your vagina. °· You have trouble urinating. °· You are urinating frequently. °· You have pain when you urinate. °· You have a change in your bowel movements. °· You have increasing redness, pain, or swelling near your vaginal incision  (episiotomy) or vaginal tear. °· You have pus draining from your episiotomy or vaginal tear. °· Your episiotomy or vaginal tear is separating. °· You have painful, hard, or reddened breasts. °· You have a severe headache. °· You have blurred vision or see spots. °· You feel sad or depressed. °· You have thoughts of hurting yourself or your newborn. °· You have questions about your care, the care of your newborn, or medicines. °· You are dizzy or light-headed. °· You have a rash. °· You have nausea or vomiting. °· You were breastfeeding and have not had a menstrual period within 12 weeks after you stopped breastfeeding. °· You are not breastfeeding and have not had a menstrual period by the 12th week after delivery. °· You have a fever. °SEEK IMMEDIATE MEDICAL CARE IF:  °· You have persistent pain. °· You have chest pain. °· You have shortness of breath. °· You faint. °· You have leg pain. °· You have stomach pain. °· Your vaginal bleeding saturates two or more sanitary pads in 1 hour. °  °This information is not intended to replace advice given to you by your health care provider. Make sure you discuss any questions you have with your health care provider. °  °Document Released: 08/08/2000 Document Revised: 05/02/2015 Document Reviewed: 04/07/2012 °Elsevier   Elsevier Interactive Patient Education ©2016 [employer] ° °

## 2016-07-04 NOTE — Lactation Note (Signed)
This note was copied from a baby's chart. Lactation Consultation Note: Mom is P3 but first time having baby to the breast. With other babies they wouldn't latch to she pumped and bottle fed. Assisted with latch in football hold. Mom reports pain with initial latch that eases off after a few minutes. Reviewed wide open mouth and keeping the baby close to the breast throughout the feeding. Does not have pump for home. Manual pump given with instructions for setup, use and cleaning, asking about milk storage- guidelines reviewed with mom. Also reviewed Baby and Me book. Reviewed our phone number to call with questions/concerns. Still nursing on second breast as I left room. No further questions at present. To call prn  Patient Name: Cristina Burke Reason for consult: Follow-up assessment   Maternal Data Formula Feeding for Exclusion: Yes Reason for exclusion: Mother's choice to formula and breast feed on admission Has patient been taught Hand Expression?: Yes Does the patient have breastfeeding experience prior to this delivery?: Yes  Feeding Feeding Type: Breast Fed  LATCH Score/Interventions Latch: Grasps breast easily, tongue down, lips flanged, rhythmical sucking.  Audible Swallowing: A few with stimulation  Type of Nipple: Everted at rest and after stimulation  Comfort (Breast/Nipple): Filling, red/small blisters or bruises, mild/mod discomfort  Problem noted: Mild/Moderate discomfort Interventions (Mild/moderate discomfort): Hand massage;Hand expression  Hold (Positioning): Assistance needed to correctly position infant at breast and maintain latch. Intervention(s): Breastfeeding basics reviewed;Position options  LATCH Score: 7  Lactation Tools Discussed/Used WIC Program: Yes Pump Review: Setup, frequency, and cleaning Initiated by:: DW Date initiated:: 07/04/16   Consult Status Consult Status: Complete    Cristina Burke, Cristina Burke Burke,  8:59 AM

## 2016-07-06 ENCOUNTER — Encounter (HOSPITAL_COMMUNITY): Payer: Self-pay | Admitting: Obstetrics & Gynecology

## 2016-07-07 ENCOUNTER — Encounter: Payer: Self-pay | Admitting: Obstetrics and Gynecology

## 2016-07-16 ENCOUNTER — Encounter (HOSPITAL_COMMUNITY): Payer: Self-pay

## 2016-08-13 ENCOUNTER — Encounter: Payer: Self-pay | Admitting: Advanced Practice Midwife

## 2016-08-13 ENCOUNTER — Ambulatory Visit (INDEPENDENT_AMBULATORY_CARE_PROVIDER_SITE_OTHER): Payer: Medicaid Other | Admitting: Advanced Practice Midwife

## 2016-08-13 NOTE — Patient Instructions (Signed)
Breastfeeding Challenges and Solutions  Even though breastfeeding is natural, it can be challenging, especially in the first few weeks after childbirth. It is normal for problems to arise when starting to breastfeed your new baby, even if you have breastfed before. This document provides some solutions to the most common breastfeeding challenges.  Challenges and solutions  Challenge--Cracked or Sore Nipples  Cracked or sore nipples are commonly experienced by breastfeeding mothers. Cracked or sore nipples often are caused by inadequate latching (when your baby's mouth attaches to your breast to breastfeed). Soreness can also happen if your baby is not positioned properly at your breast. Although nipple cracking and soreness are common during the first week after birth, nipple pain is never normal. If you experience nipple cracking or soreness that lasts longer than 1 week or nipple pain, call your health care provider or lactation consultant.  Solution  Ensure proper latching and positioning of your baby by following the steps below:  · Find a comfortable place to sit or lie down, with your neck and back well supported.  · Place a pillow or rolled up blanket under your baby to bring him or her to the level of your breast (if you are seated).  · Make sure that your baby's abdomen is facing your abdomen.  · Gently massage your breast. With your fingertips, massage from your chest wall toward your nipple in a circular motion. This encourages milk flow. You may need to continue this action during the feeding if your milk flows slowly.  · Support your breast with 4 fingers underneath and your thumb above your nipple. Make sure your fingers are well away from your nipple and your baby’s mouth.  · Stroke your baby's lips gently with your finger or nipple.  · When your baby's mouth is open wide enough, quickly bring your baby to your breast, placing your entire nipple and as much of the colored area around your nipple  (areola) as possible into your baby's mouth.  ? More areola should be visible above your baby's upper lip than below the lower lip.  ? Your baby's tongue should be between his or her lower gum and your breast.  · Ensure that your baby's mouth is correctly positioned around your nipple (latched). Your baby's lips should create a seal on your breast and be turned out (everted).  · It is common for your baby to suck for about 2-3 minutes in order to start the flow of breast milk.    Signs that your baby has successfully latched on to your nipple include:  · Quietly tugging or quietly sucking without causing you pain.  · Swallowing heard between every 3-4 sucks.  · Muscle movement above and in front of his or her ears with sucking.    Signs that your baby has not successfully latched on to nipple include:  · Sucking sounds or smacking sounds from your baby while nursing.  · Nipple pain.    Ensure that your breasts stay moisturized and healthy by:  · Avoiding the use of soap on your nipples.  · Wearing a supportive bra. Avoid wearing underwire-style bras or tight bras.  · Air drying your nipples for 3-4 minutes after each feeding.  · Using only cotton bra pads to absorb breast milk leakage. Leaking of breast milk between feedings is normal. Be sure to change the pads if they become soaked with milk.  · Using lanolin on your nipples after nursing. Lanolin helps to maintain your   skin's normal moisture barrier. If you use pure lanolin you do not need to wash it off before feeding your baby again. Pure lanolin is not toxic to your baby. You may also hand express a few drops of breast milk and gently massage that milk into your nipples, allowing it to air dry.    Challenge--Breast Engorgement  Breast engorgement is the overfilling of your breasts with breast milk. In the first few weeks after giving birth, you may experience breast engorgement. Breast engorgement can make your breasts throb and feel hard, tightly stretched,  warm, and tender. Engorgement peaks about the fifth day after you give birth. Having breast engorgement does not mean you have to stop breastfeeding your baby.  Solution  · Breastfeed when you feel the need to reduce the fullness of your breasts or when your baby shows signs of hunger. This is called "breastfeeding on demand."  · Newborns (babies younger than 4 weeks) often breastfeed every 1-3 hours during the day. You may need to awaken your baby to feed if he or she is asleep at a feeding time.  · Do not allow your baby to sleep longer than 5 hours during the night without a feeding.  · Pump or hand express breast milk before breastfeeding to soften your breast, areola, and nipple.  · Apply warm, moist heat (in the shower or with warm water-soaked hand towels) just before feeding or pumping, or massage your breast before or during breastfeeding. This increases circulation and helps your milk to flow.  · Completely empty your breasts when breastfeeding or pumping. Afterward, wear a snug bra (nursing or regular) or tank top for 1-2 days to signal your body to slightly decrease milk production. Only wear snug bras or tank tops to treat engorgement. Tight bras typically should be avoided by breastfeeding mothers. Once engorgement is relieved, return to wearing regular, loose-fitting clothes.  · Apply ice packs to your breasts to lessen the pain from engorgement and relieve swelling, unless the ice is uncomfortable for you.  · Do not delay feedings. Try to relax when it is time to feed your baby. This helps to trigger your "let-down reflex," which releases milk from your breast.  · Ensure your baby is latched on to your breast and positioned properly while breastfeeding.  · Allow your baby to remain at your breast as long as he or she is latched on well and actively sucking. Your baby will let you know when he or she is done breastfeeding by pulling away from your breast or falling asleep.  · Avoid introducing bottles  or pacifiers to your baby in the early weeks of breastfeeding. Wait to introduce these things until after resolving any breastfeeding challenges.  · Try to pump your milk on the same schedule as when your baby would breastfeed if you are returning to work or away from home for an extended period.  · Drink plenty of fluids to avoid dehydration, which can eventually put you at greater risk of breast engorgement.    If you follow these suggestions, your engorgement should improve in 24-48 hours. If you are still experiencing difficulty, call your lactation consultant or health care provider.  Challenge--Plugged Milk Ducts  Plugged milk ducts occur when the duct does not drain milk effectively and becomes swollen. Wearing a tight-fitting nursing bra or having difficulty with latching may cause plugged milk ducts. Not drinking enough water (8-10 c [1.9-2.4 L] per day) can contribute to plugged milk ducts. Once a   duct has become plugged, hard lumps, soreness, and redness may develop in your breast.  Solution  Do not delay feedings. Feed your baby frequently and try to empty your breasts of milk at each feeding. Try breastfeeding from the affected side first so there is a better chance that the milk will drain completely from that breast. Apply warm, moist towels to your breasts for 5-10 minutes before feeding. Alternatively, a hot shower right before breastfeeding can provide the moist heat that can encourage milk flow. Gentle massage of the sore area before and during a feeding may also help. Avoid wearing tight clothing or bras that put pressure on your breasts. Wear bras that offer good support to your breasts, but avoid underwire bras. If you have a plugged milk duct and develop a fever, you need to see your health care provider.  Challenge--Mastitis  Mastitis is inflammation of your breast. It usually is caused by a bacterial infection and can cause flu-like symptoms. You may develop redness in your breast and a  fever. Often when mastitis occurs, your breast becomes firm, warm, and very painful. The most common causes of mastitis are poor latching, ineffective sucking from your baby, consistent pressure on your breast (possibly from wearing a tight-fitting bra or shirt that restricts the milk flow), unusual stress or fatigue, or missed feedings.  Solution  You will be given antibiotic medicine to treat the infection. It is still important to breastfeed frequently to empty your breasts. Continuing to breastfeed while you recover from mastitis will not harm your baby. Make sure your baby is positioned properly during every feeding. Apply moist heat to your breasts for a few minutes before feeding to help the milk flow and to help your breasts empty more easily.  Challenge--Thrush  Thrush is a yeast infection that can form on your nipples, in your breast, or in your baby's mouth. It causes itching, soreness, burning or stabbing pain, and sometimes a rash.  Solution  You will be given a medicated ointment for your nipples, and your baby will be given a liquid medicine for his or her mouth. It is important that you and your baby are treated at the same time because thrush can be passed between you and your baby. Change disposable nursing pads often. Any bras, towels, or clothing that come in contact with infected areas of your body or your baby's body need to be washed in very hot water every day. Wash your hands and your baby's hands often. All pacifiers, bottle nipples, or toys your baby puts in his or her mouth should be boiled once a day for 20 minutes. After 1 week of treatment, discard pacifiers and bottle nipples and buy new ones. All breast pump parts that touch the milk need to be boiled for 20 minutes every day.  Challenge--Low Milk Supply  You may not be producing enough milk if your baby is not gaining the proper amount of weight. Breast milk production is based on a supply-and-demand system. Your milk supply depends  on how frequently and effectively your baby empties your breast.  Solution  The more you breastfeed and pump, the more breast milk you will produce. It is important that your baby empties at least one of your breasts at each feeding. If this is not happening, then use a breast pump or hand express any milk that remains. This will help to drain as much milk as possible at each feeding. It will also signal your body to produce more   milk. If your baby is not emptying your breasts, it may be due to latching, sucking, or positioning problems. If low milk supply continues after addressing these issues, contact your health care provider or a lactation specialist as soon as possible.  Challenge--Inverted or Flat Nipples  Some women have nipples that turn inward instead of protruding outward. Other women have nipples that are flat. Inverted or flat nipples can sometimes make it more difficult for your baby to latch onto your breast.  Solution  You may be given a small device that pulls out inverted nipples. This device should be applied right before your baby is brought to your breast. You can also try using a breast pump for a short time before placing the baby at your breast. The pump can pull your nipple outwards to help your infant latch more easily. The baby's sucking motion will help the inverted nipple protrude as well.  If you have flat nipples, encourage your baby to latch onto your breast and feed frequently in the early days after birth. This will give your baby practice latching on correctly while your breast is still soft. When your milk supply increases, between the second and fifth day after birth and your breasts become full, your baby will have an easier time latching.  Contact a lactation consultant if you still have concerns. She or he can teach you additional techniques to address breastfeeding problems related to nipple shape and position.  Where to find more information:  [employer]:  www.llli.org  This information is not intended to replace advice given to you by your health care provider. Make sure you discuss any questions you have with your health care provider.  Document Released: 02/02/2006 Document Revised: 01/23/2016 Document Reviewed: 02/04/2013  Elsevier Interactive Patient Education © 2017 [employer]

## 2016-08-13 NOTE — Progress Notes (Signed)
Subjective:     Cristina BeamJennifer Yuille is a 25 y.o. female who presents for a postpartum visit. She is 6 weeks postpartum following a spontaneous vaginal delivery. I have fully reviewed the prenatal and intrapartum course. The delivery was at 39.4 gestational weeks. Outcome: spontaneous vaginal delivery. Anesthesia: epidural. Postpartum course has been uncomplicated. Baby's course has been uncomplicated. Baby is feeding by breast. Bleeding no bleeding. Bowel function is normal. Bladder function is normal. Patient is not sexually active. Contraception method is tubal ligation. Postpartum depression screening: negative.  The following portions of the patient's history were reviewed and updated as appropriate: allergies, current medications, past family history, past medical history, past social history, past surgical history and problem list.  Review of Systems Pertinent items are noted in HPI.   Objective:    There were no vitals taken for this visit.  General:  alert, cooperative, appears stated age and no distress   Breasts:  Declined  Lungs: clear to auscultation bilaterally  Heart:  regular rate and rhythm, S1, S2 normal, no murmur, click, rub or gallop  Abdomen: soft, non-tender; bowel sounds normal; no masses,  no organomegaly. Umbilical incision well-healed.    Vulva:  not evaluated  Vagina: not evaluated        Assessment:     Normal postpartum exam. Pap smear not done at today's visit.   Plan:   1. Contraception: tubal ligation 2. Follow up in: 1 year or as needed.

## 2017-03-09 IMAGING — US US OB COMP LESS 14 WK
1 series · 15 of 26 positions shown · non-contrast
Comparison: 12/19/2011.

CLINICAL DATA: Pregnancy.

EXAM:
OBSTETRIC <14 WK US AND TRANSVAGINAL OB US
TECHNIQUE: Both transabdominal and transvaginal ultrasound examinations were
performed for complete evaluation of the gestation as well as the
maternal uterus, adnexal regions, and pelvic cul-de-sac.
Transvaginal technique was performed to assess early pregnancy.

[Series 1: us ob comp less 14 wk · 26 acquisitions, 15 frames shown]
[im 1/26]
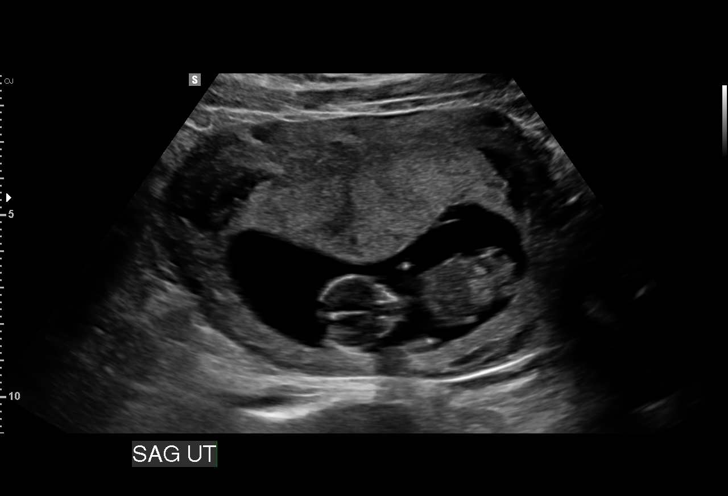
[im 3/26]
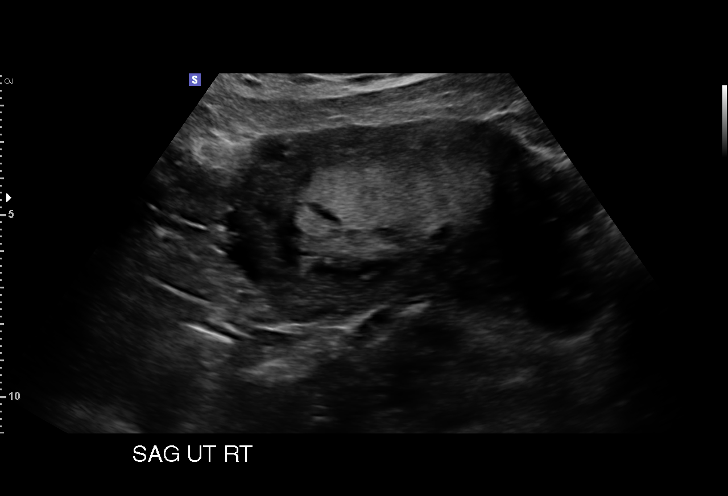
[im 5/26]
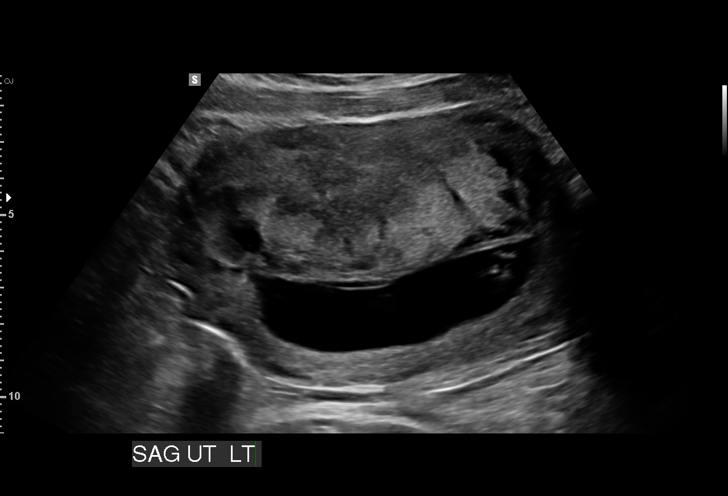
[im 7/26]
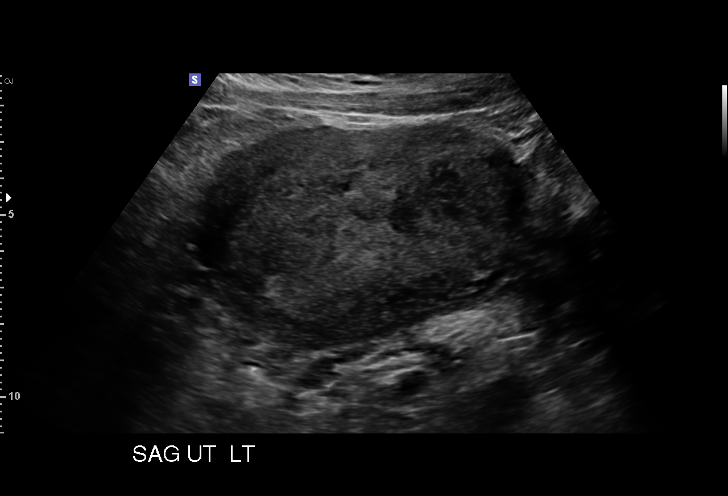
[im 8/26]
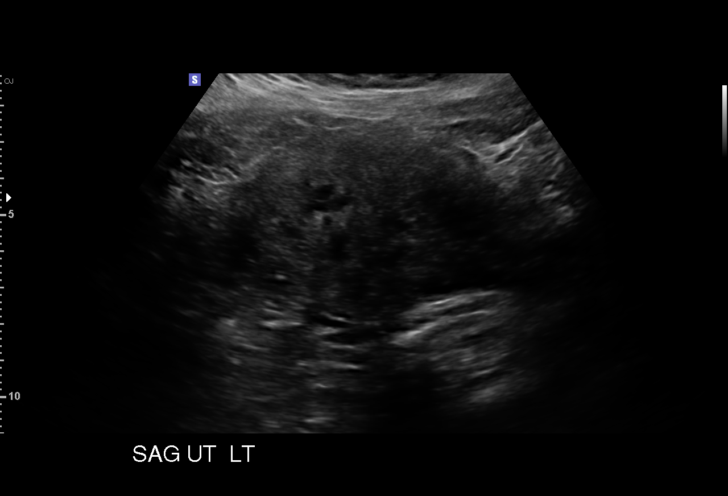
[im 10/26]
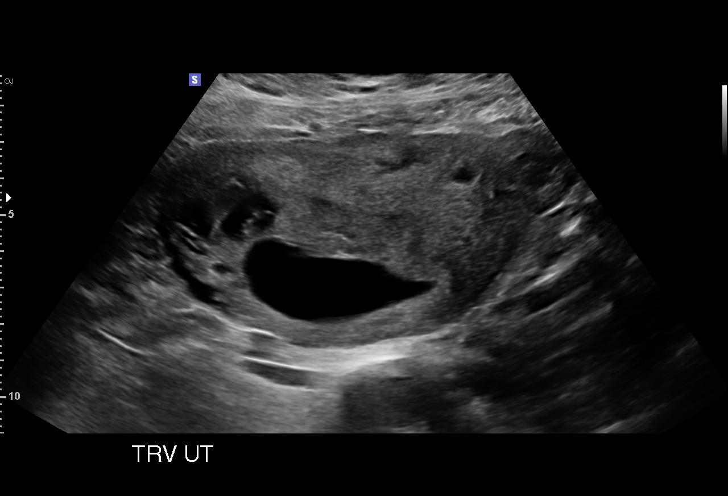
[im 12/26]
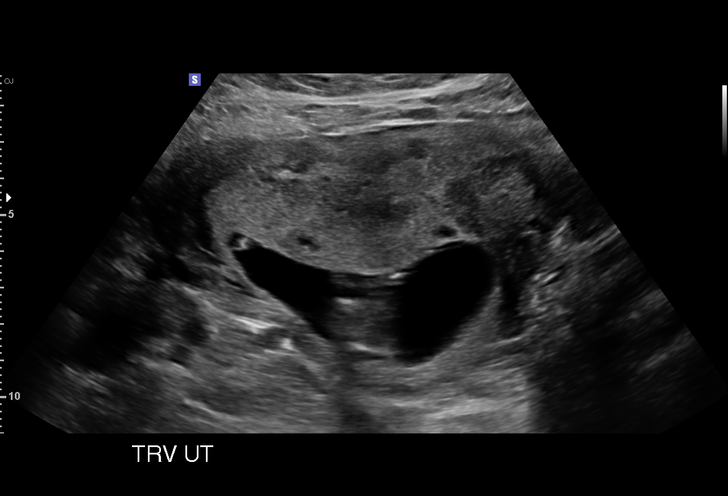
[im 14/26]
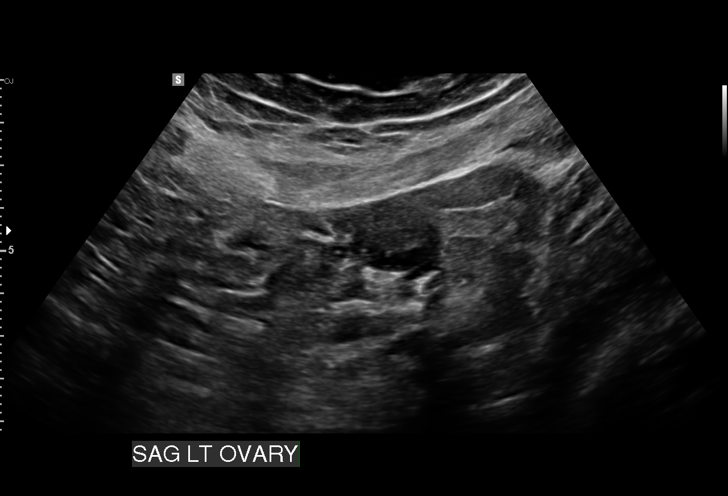
[im 15/26]
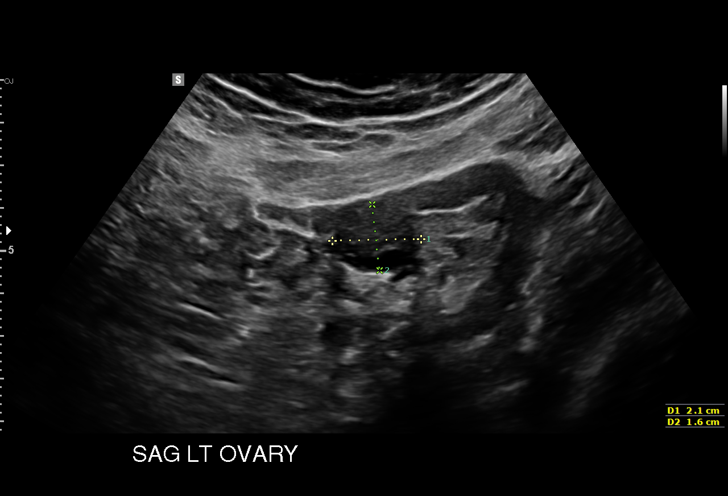
[im 17/26]
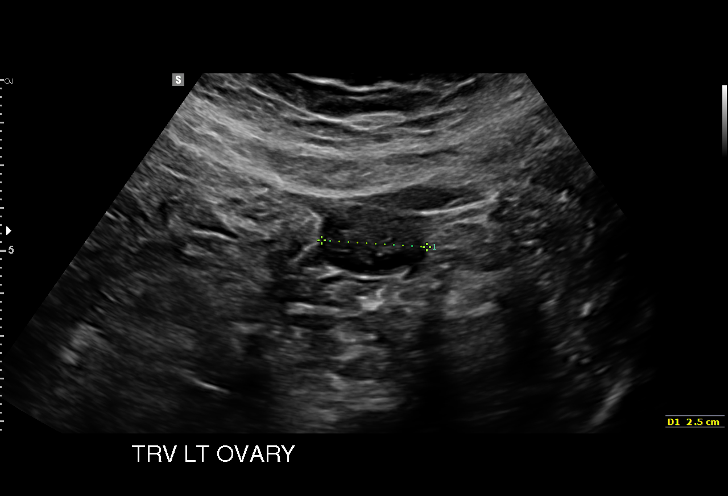
[im 19/26]
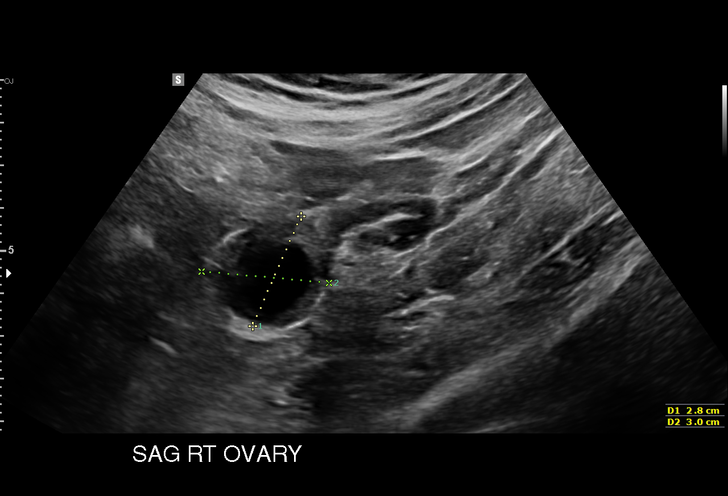
[im 20/26]
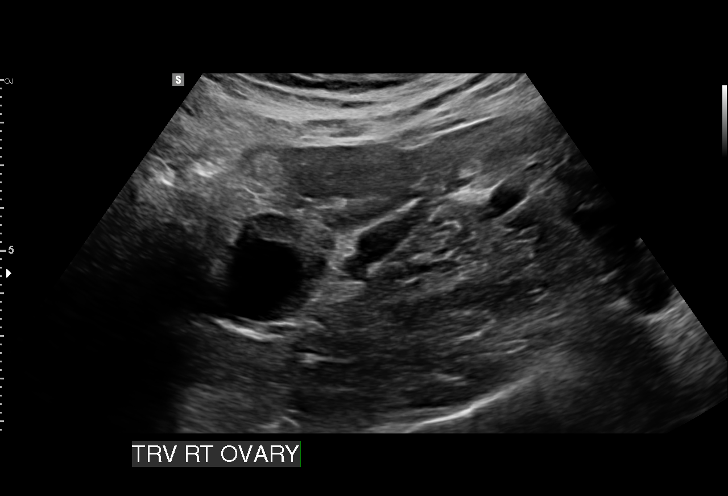
[im 22/26]
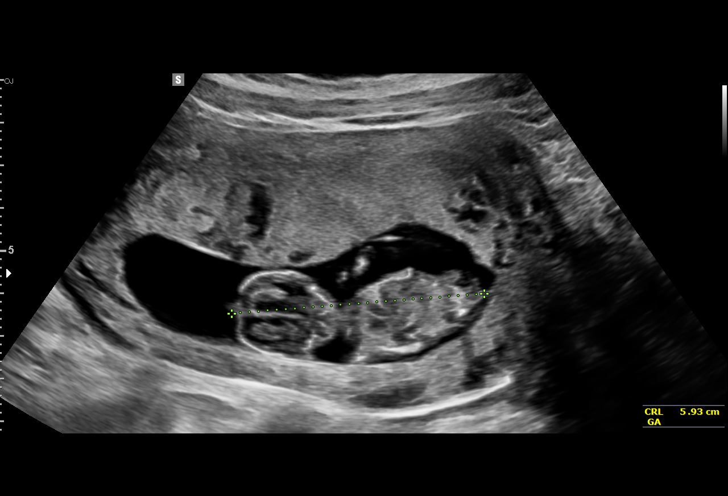
[im 24/26]
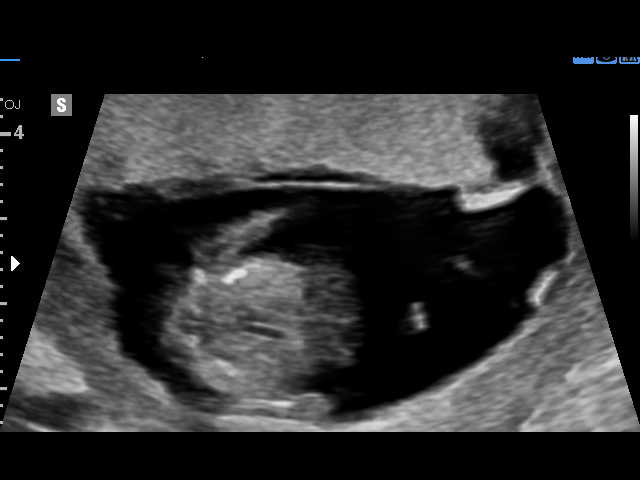
[im 26/26]
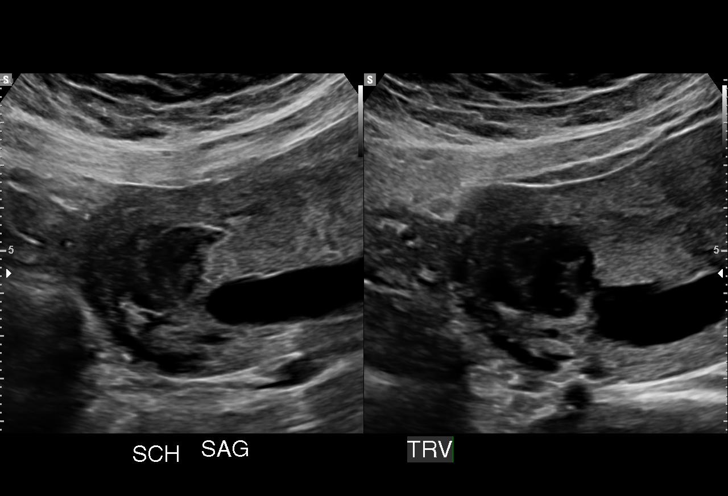

[15 of 26 positions shown; findings below may reference images not displayed]

FINDINGS: Intrauterine gestational sac: Single

Yolk sac:  No

Embryo:  Visualized

Cardiac Activity: Visualized

Heart Rate: 167  bpm

CRL:  5.8 cm  mm   12 w   2 d                  US EDC: 07/05/2016.

Subchorionic hemorrhage:  Small subchorionic hemorrhage.

Maternal uterus/adnexae: No significant abnormality identified. No
free pelvic fluid .
IMPRESSION: Single viable injury pregnancy at 12 weeks 2 days.

## 2018-10-03 ENCOUNTER — Ambulatory Visit: Payer: Self-pay | Admitting: Family Medicine

## 2018-10-03 ENCOUNTER — Encounter: Payer: Self-pay | Admitting: Family Medicine

## 2018-10-03 DIAGNOSIS — N898 Other specified noninflammatory disorders of vagina: Secondary | ICD-10-CM

## 2018-10-03 DIAGNOSIS — N390 Urinary tract infection, site not specified: Secondary | ICD-10-CM

## 2018-10-03 DIAGNOSIS — B379 Candidiasis, unspecified: Secondary | ICD-10-CM

## 2018-10-03 DIAGNOSIS — R300 Dysuria: Secondary | ICD-10-CM

## 2018-10-03 LAB — POCT URINALYSIS DIPSTICK
BILIRUBIN UA: NEGATIVE
Glucose, UA: NEGATIVE
Ketones, UA: NEGATIVE
LEUKOCYTES UA: NEGATIVE
NITRITE UA: NEGATIVE
PH UA: 7.5 (ref 5.0–8.0)
PROTEIN UA: NEGATIVE
RBC UA: NEGATIVE
Spec Grav, UA: 1.01 (ref 1.010–1.025)
UROBILINOGEN UA: 0.2 U/dL

## 2018-10-03 MED ORDER — FLUCONAZOLE 150 MG PO TABS: 150.0000 mg | ORAL_TABLET | Freq: Once | ORAL | 0 refills | Status: AC

## 2018-10-03 MED ORDER — CEPHALEXIN 500 MG PO CAPS: 500.0000 mg | ORAL_CAPSULE | Freq: Two times a day (BID) | ORAL | 0 refills | Status: AC

## 2018-10-03 NOTE — Progress Notes (Signed)
Cristina Burke is a 28 y.o. female who presents today with 1 days of urinary symptoms burning with urination and feelings of nausea. She has not attempted any treatments up to this point. Denies risk for pregnancy or STI/STD due to being married and tubal x 2 years ago.Denies family history of ectopic or abdomen pain, or fever.   Review of Systems  Constitutional: Negative for chills, fever and malaise/fatigue.  HENT: Negative for congestion, ear discharge, ear pain, sinus pain and sore throat.   Eyes: Negative.   Respiratory: Negative for cough, sputum production and shortness of breath.   Cardiovascular: Negative.  Negative for chest pain.  Gastrointestinal: Positive for nausea. Negative for abdominal pain, diarrhea and vomiting.  Genitourinary: Positive for dysuria. Negative for frequency, hematuria and urgency.  Musculoskeletal: Negative for back pain and myalgias.  Skin: Negative.   Neurological: Negative for headaches.  Endo/Heme/Allergies: Negative.   Psychiatric/Behavioral: Negative.     Cristina Burke has a current medication list which includes the following prescription(s): cephalexin, docusate sodium, fluconazole, ibuprofen, and oxycodone-acetaminophen. Also has No Known Allergies.  Cristina Burke  has a past medical history of Medical history non-contributory and No pertinent past medical history. Also  has a past surgical history that includes No past surgeries and Tubal ligation (Bilateral, 07/03/2016).    O: Vitals:   10/03/18 1322  BP: 90/68  Pulse: 86  Resp: 16  Temp: 98.3 F (36.8 C)  SpO2: 98%    Physical Exam Vitals signs reviewed.  Constitutional:      Appearance: She is well-developed. She is not toxic-appearing.  HENT:     Head: Normocephalic.     Right Ear: Hearing, tympanic membrane, ear canal and external ear normal.     Left Ear: Hearing, tympanic membrane, ear canal and external ear normal.     Nose: Nose normal. No congestion or rhinorrhea.   Mouth/Throat:     Lips: Pink.     Pharynx: Uvula midline.  Neck:     Musculoskeletal: Normal range of motion and neck supple.  Cardiovascular:     Rate and Rhythm: Normal rate and regular rhythm.     Pulses: Normal pulses.     Heart sounds: Normal heart sounds.  Pulmonary:     Effort: Pulmonary effort is normal.     Breath sounds: Normal breath sounds.  Abdominal:     General: Bowel sounds are normal.     Palpations: Abdomen is soft.     Tenderness: There is no abdominal tenderness. There is no right CVA tenderness, left CVA tenderness, guarding or rebound.     Comments: No evidence of suprapubic pain or flank pain on exam  Musculoskeletal: Normal range of motion.  Lymphadenopathy:     Head:     Right side of head: No submental or submandibular adenopathy.     Left side of head: No submental or submandibular adenopathy.     Cervical: No cervical adenopathy.  Neurological:     Mental Status: She is alert and oriented to person, place, and time.    A: 1. Dysuria   2. Urinary tract infection without hematuria, site unspecified   3. Vaginal itching   4. 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 She was frustrated and tearful related to her feelings of frequency of symptoms and no finding she is under the care of GYN and was advised to make a follow up as well as red flag for urgent/emergent eval in ED. Patient v/u.   1. Dysuria - POCT urinalysis dipstick Results for orders placed or  performed in visit on 10/03/18 (from the past 24 hour(s))  POCT urinalysis dipstick     Status: Normal   Collection Time: 10/03/18  1:31 PM  Result Value Ref Range   Color, UA LT YELLOW    Clarity, UA CLEAR    Glucose, UA Negative Negative   Bilirubin, UA NEGATIVE    Ketones, UA NEGATIVE    Spec Grav, UA 1.010 1.010 - 1.025   Blood, UA NEGATIE    pH, UA 7.5 5.0 - 8.0   Protein, UA Negative Negative   Urobilinogen, UA 0.2 0.2 or 1.0 E.U./dL   Nitrite, UA NEGATIVE    Leukocytes, UA Negative Negative   Appearance     Odor       2. Urinary tract infection without hematuria, site unspecified - cephALEXin (KEFLEX) 500 MG capsule; Take 1 capsule (500 mg total) by mouth 2 (two) times daily for 7 days.  3. Vaginal itching Monitor for improvement with treatment fo other condition- follow up with Gyn if symptoms persist. 4. Yeast infection - fluconazole (DIFLUCAN) 150 MG tablet; Take 1 tablet (150 mg total) by mouth once for 1 dose.   Discussed with patient exam findings, suspected diagnosis etiology and  reviewed recommended treatment plan and follow up, including complications and indications for urgent medical follow up and evaluation. Medications including use and indications reviewed with patient. Patient provided relevant patient education on diagnosis and/or relevant related condition that were discussed and reviewed with patient at discharge. Patient verbalized understanding of information provided and agrees with plan of care (POC), all questions answered.

## 2018-10-03 NOTE — Patient Instructions (Signed)
Urinary Tract Infection, Adult A urinary tract infection (UTI) is an infection of any part of the urinary tract. The urinary tract includes:  The kidneys.  The ureters.  The bladder.  The urethra. These organs make, store, and get rid of pee (urine) in the body. What are the causes? This is caused by germs (bacteria) in your genital area. These germs grow and cause swelling (inflammation) of your urinary tract. What increases the risk? You are more likely to develop this condition if:  You have a small, thin tube (catheter) to drain pee.  You cannot control when you pee or poop (incontinence).  You are female, and: ? You use these methods to prevent pregnancy: ? A medicine that kills sperm (spermicide). ? A device that blocks sperm (diaphragm). ? You have low levels of a female hormone (estrogen). ? You are pregnant.  You have genes that add to your risk.  You are sexually active.  You take antibiotic medicines.  You have trouble peeing because of: ? A prostate that is bigger than normal, if you are female. ? A blockage in the part of your body that drains pee from the bladder (urethra). ? A kidney stone. ? A nerve condition that affects your bladder (neurogenic bladder). ? Not getting enough to drink. ? Not peeing often enough.  You have other conditions, such as: ? Diabetes. ? A weak disease-fighting system (immune system). ? Sickle cell disease. ? Gout. ? Injury of the spine. What are the signs or symptoms? Symptoms of this condition include:  Needing to pee right away (urgently).  Peeing often.  Peeing small amounts often.  Pain or burning when peeing.  Blood in the pee.  Pee that smells bad or not like normal.  Trouble peeing.  Pee that is cloudy.  Fluid coming from the vagina, if you are female.  Pain in the belly or lower back. Other symptoms include:  Throwing up (vomiting).  No urge to eat.  Feeling mixed up (confused).  Being tired  and grouchy (irritable).  A fever.  Watery poop (diarrhea). How is this treated? This condition may be treated with:  Antibiotic medicine.  Other medicines.  Drinking enough water. Follow these instructions at home:  Medicines  Take over-the-counter and prescription medicines only as told by your doctor.  If you were prescribed an antibiotic medicine, take it as told by your doctor. Do not stop taking it even if you start to feel better. General instructions  Make sure you: ? Pee until your bladder is empty. ? Do not hold pee for a long time. ? Empty your bladder after sex. ? Wipe from front to back after pooping if you are a female. Use each tissue one time when you wipe.  Drink enough fluid to keep your pee pale yellow.  Keep all follow-up visits as told by your doctor. This is important. Contact a doctor if:  You do not get better after 1-2 days.  Your symptoms go away and then come back. Get help right away if:  You have very bad back pain.  You have very bad pain in your lower belly.  You have a fever.  You are sick to your stomach (nauseous).  You are throwing up. Summary  A urinary tract infection (UTI) is an infection of any part of the urinary tract.  This condition is caused by germs in your genital area.  There are many risk factors for a UTI. These include having a small, thin   tube to drain pee and not being able to control when you pee or poop.  Treatment includes antibiotic medicines for germs.  Drink enough fluid to keep your pee pale yellow. This information is not intended to replace advice given to you by your health care provider. Make sure you discuss any questions you have with your health care provider. Document Released: 01/28/2008 Document Revised: 02/18/2018 Document Reviewed: 02/18/2018 Elsevier Interactive Patient Education  2019 Elsevier Inc.  

## 2018-11-29 ENCOUNTER — Ambulatory Visit
Admission: EM | Admit: 2018-11-29 | Discharge: 2018-11-29 | Disposition: A | Discharge status: Home / Self Care | Payer: Self-pay | Attending: Family Medicine | Admitting: Family Medicine

## 2018-11-29 ENCOUNTER — Other Ambulatory Visit: Payer: Self-pay

## 2018-11-29 DIAGNOSIS — F411 Generalized anxiety disorder: Secondary | ICD-10-CM

## 2018-11-29 MED ORDER — FLUOXETINE HCL 10 MG PO TABS: 10.0000 mg | ORAL_TABLET | Freq: Every day | ORAL | 3 refills | Status: DC

## 2018-11-29 NOTE — ED Triage Notes (Signed)
Pt states did a virtual visit last week for her SOB and body tingling, d/x'd with anxiety. States still having the same s/sx's

## 2018-11-29 NOTE — ED Provider Notes (Signed)
[name]    CSN: [id number] Arrival date & time: 11/29/18  1611     History   Chief Complaint Chief Complaint  Patient presents with  . Panic Attack    HPI Cristina Burke is a 28 y.o. female.   Pt states did a virtual visit last week for her SOB and body tingling, d/x'd with anxiety. States still having the same s/sx's.     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  Doesn't feel like she can exercise.    No leg pain or chest pain     Past Medical History:  Diagnosis Date  . Medical history non-contributory   . No pertinent past medical history     There are no active problems to display for this patient.   Past Surgical History:  Procedure Laterality Date  . NO PAST SURGERIES    . TUBAL LIGATION Bilateral 07/03/2016   Procedure: POST PARTUM TUBAL LIGATION;  Surgeon: Adam Phenix, MD;  Location: [facility];  Service: Gynecology;  Laterality: Bilateral;    OB History    Gravida  3   Para  3   Term  3   Preterm      AB      Living  3     SAB      TAB      Ectopic      Multiple  0   Live Births  3            Home Medications    Prior to Admission medications   Medication Sig Start Date End Date Taking? Authorizing Provider  FLUoxetine (PROZAC) 10 MG tablet Take 1 tablet (10 mg total) by mouth daily. 11/29/18   Elvina Sidle, MD    Family History Family History  Problem Relation Age of Onset  . Hypertension Mother   . Diabetes Mother   . Diabetes Brother   . Diabetes Sister     Social History Social History   Tobacco Use  . Smoking status: Never Smoker  . Smokeless tobacco: Never Used  Substance Use Topics  . Alcohol use: No  . Drug use: No     Allergies   Patient has no known allergies.   Review of Systems Review of Systems  Respiratory: Positive for chest tightness and shortness of  breath.   All other systems reviewed and are negative.    Physical Exam Triage Vital Signs ED Triage Vitals [11/29/18 1619]  Enc Vitals Group     BP 121/77     Pulse Rate 99     Resp 18     Temp 98.2 F (36.8 C)     Temp Source Oral     SpO2 98 %     Weight      Height      Head Circumference      Peak Flow      Pain Score 0     Pain Loc      Pain Edu?      Excl. in GC?    No data found.  Updated Vital Signs BP 121/77 (BP Location: Left Arm)   Pulse 99   Temp 98.2 F (36.8 C) (Oral)   Resp 18   LMP 11/08/2018   SpO2 98%   Physical Exam Vitals signs and nursing note reviewed.  Constitutional:  Appearance: Normal appearance.  HENT:     Head: Normocephalic.     Mouth/Throat:     Pharynx: Oropharynx is clear.  Eyes:     Conjunctiva/sclera: Conjunctivae normal.  Neck:     Musculoskeletal: Normal range of motion and neck supple.  Cardiovascular:     Rate and Rhythm: Normal rate and regular rhythm.     Pulses: Normal pulses.     Heart sounds: Normal heart sounds. No murmur.  Pulmonary:     Effort: Pulmonary effort is normal.     Breath sounds: Normal breath sounds.  Musculoskeletal: Normal range of motion.  Skin:    General: Skin is warm and dry.  Neurological:     General: No focal deficit present.     Mental Status: She is alert and oriented to person, place, and time.  Psychiatric:        Mood and Affect: Mood normal.        Behavior: Behavior normal.        Thought Content: Thought content normal.      UC Treatments / Results  Labs (all labs ordered are listed, but only abnormal results are displayed) Labs Reviewed - No data to display  EKG None  Radiology No results found.  Procedures Procedures (including critical care time)  Medications Ordered in UC Medications - No data to display  Initial Impression / Assessment and Plan / UC Course  I have reviewed the triage vital signs and the nursing notes.  Pertinent labs & imaging  results that were available during my care of the patient were reviewed by me and considered in my medical decision making (see chart for details).    Final Clinical Impressions(s) / UC Diagnoses   Final diagnoses:  Anxiety state   Discharge Instructions   None    ED Prescriptions    Medication Sig Dispense Auth. Provider   FLUoxetine (PROZAC) 10 MG tablet Take 1 tablet (10 mg total) by mouth daily. 30 tablet Elvina Sidle, MD     Controlled Substance Prescriptions [place] Controlled Substance Registry consulted? Not Applicable   Elvina Sidle, MD 11/29/18 [phone]

## 2019-01-04 ENCOUNTER — Ambulatory Visit
Admission: EM | Admit: 2019-01-04 | Discharge: 2019-01-04 | Disposition: A | Discharge status: Home / Self Care | Payer: Self-pay | Attending: Physician Assistant | Admitting: Physician Assistant

## 2019-01-04 ENCOUNTER — Encounter: Payer: Self-pay | Admitting: Emergency Medicine

## 2019-01-04 ENCOUNTER — Other Ambulatory Visit: Payer: Self-pay

## 2019-01-04 DIAGNOSIS — B349 Viral infection, unspecified: Secondary | ICD-10-CM

## 2019-01-04 MED ORDER — FLUTICASONE PROPIONATE 50 MCG/ACT NA SUSP: 2.0000 | Freq: Every day | NASAL | 0 refills | Status: DC

## 2019-01-04 MED ORDER — IPRATROPIUM BROMIDE 0.06 % NA SOLN: 2.0000 | Freq: Four times a day (QID) | NASAL | 0 refills | Status: DC

## 2019-01-04 MED ORDER — BENZONATATE 100 MG PO CAPS: 100.0000 mg | ORAL_CAPSULE | Freq: Three times a day (TID) | ORAL | 0 refills | Status: DC

## 2019-01-04 NOTE — Discharge Instructions (Signed)
As discussed, cannot rule out COVID. Currently, no alarming signs. Start tessalon for cough. Atrovent, flonase for nasal congestion/drainage. I would like you to quarantine for 7 days since symptoms onset AND >72 hours without fever, and improved respiratory symptoms. Continue to monitor, you can call COVID hotline [phone]) or use [facility] visit online if symptoms worsens to determine where you should seek care. If experiencing shortness of breath, trouble breathing, call 911 and provide them with your current situation.

## 2019-01-04 NOTE — ED Triage Notes (Signed)
Pt presents to [facility] for 3 days of sore throat, bilateral ear pain and fatigue.  Coughing at triage as well.

## 2019-01-04 NOTE — ED Notes (Signed)
Patient able to ambulate independently  

## 2019-01-04 NOTE — ED Provider Notes (Signed)
[name]    CSN: [id number] Arrival date & time: 01/04/19  1431     History   Chief Complaint Chief Complaint  Patient presents with  . URI    HPI Cristina Burke is a 28 y.o. female.   28 year old female comes in for 3-day history of URI symptoms.  She has had sore throat, rhinorrhea, nasal congestion, bilateral ear discomfort, dry cough.  She denies painful swallowing, trouble breathing, trouble swallowing, swelling of the throat, tripoding, drooling, trismus.  Denies fever.  Has had mild chills intermittently.  Denies shortness of breath, wheezing.  Denies body aches.  Took DayQuil 3 hours prior to arrival.  Denies obvious sick contact.  Works at [employer], no known COVID contact.  Never smoker.     Past Medical History:  Diagnosis Date  . Medical history non-contributory   . No pertinent past medical history     There are no active problems to display for this patient.   Past Surgical History:  Procedure Laterality Date  . NO PAST SURGERIES    . TUBAL LIGATION Bilateral 07/03/2016   Procedure: POST PARTUM TUBAL LIGATION;  Surgeon: Adam Phenix, MD;  Location: [facility];  Service: Gynecology;  Laterality: Bilateral;    OB History    Gravida  3   Para  3   Term  3   Preterm      AB      Living  3     SAB      TAB      Ectopic      Multiple  0   Live Births  3            Home Medications    Prior to Admission medications   Medication Sig Start Date End Date Taking? Authorizing Provider  benzonatate (TESSALON) 100 MG capsule Take 1 capsule (100 mg total) by mouth every 8 (eight) hours. 01/04/19   Cathie Hoops, Raynisha Avilla V, PA-C  FLUoxetine (PROZAC) 10 MG tablet Take 1 tablet (10 mg total) by mouth daily. 11/29/18   Elvina Sidle, MD  fluticasone (FLONASE) 50 MCG/ACT nasal spray Place 2 sprays into both nostrils daily. 01/04/19   Cathie Hoops, Constantina Laseter V, PA-C  ipratropium (ATROVENT) 0.06 % nasal spray Place 2 sprays into both nostrils 4 (four) times daily.  01/04/19   Belinda Fisher, PA-C    Family History Family History  Problem Relation Age of Onset  . Hypertension Mother   . Diabetes Mother   . Diabetes Brother   . Diabetes Sister     Social History Social History   Tobacco Use  . Smoking status: Never Smoker  . Smokeless tobacco: Never Used  Substance Use Topics  . Alcohol use: No  . Drug use: No     Allergies   Patient has no known allergies.   Review of Systems Review of Systems  Reason unable to perform ROS: See HPI as above.     Physical Exam Triage Vital Signs ED Triage Vitals  Enc Vitals Group     BP 01/04/19 1442 114/75     Pulse Rate 01/04/19 1442 93     Resp 01/04/19 1442 18     Temp 01/04/19 1442 98.2 F (36.8 C)     Temp Source 01/04/19 1442 Oral     SpO2 01/04/19 1442 98 %     Weight --      Height --      Head Circumference --  Peak Flow --      Pain Score 01/04/19 1441 5     Pain Loc --      Pain Edu? --      Excl. in GC? --    No data found.  Updated Vital Signs BP 114/75 (BP Location: Left Arm)   Pulse 93   Temp 98.2 F (36.8 C) (Oral) Comment: Dayquil at 11am  Resp 18   LMP 01/04/2019   SpO2 98%   Physical Exam Constitutional:      General: She is not in acute distress.    Appearance: Normal appearance. She is not ill-appearing, toxic-appearing or diaphoretic.  HENT:     Head: Normocephalic and atraumatic.     Right Ear: Tympanic membrane, ear canal and external ear normal. Tympanic membrane is not erythematous or bulging.     Left Ear: Tympanic membrane, ear canal and external ear normal. Tympanic membrane is not erythematous or bulging.     Mouth/Throat:     Mouth: Mucous membranes are moist.     Pharynx: Oropharynx is clear. Uvula midline.     Tonsils: No tonsillar exudate. 2+ on the right. 2+ on the left.  Neck:     Musculoskeletal: Normal range of motion and neck supple.  Cardiovascular:     Rate and Rhythm: Normal rate and regular rhythm.     Heart sounds: Normal  heart sounds. No murmur. No friction rub. No gallop.   Pulmonary:     Effort: Pulmonary effort is normal. No accessory muscle usage, prolonged expiration, respiratory distress or retractions.     Comments: Lungs clear to auscultation without adventitious lung sounds. Neurological:     General: No focal deficit present.     Mental Status: She is alert and oriented to person, place, and time.      UC Treatments / Results  Labs (all labs ordered are listed, but only abnormal results are displayed) Labs Reviewed - No data to display  EKG None  Radiology No results found.  Procedures Procedures (including critical care time)  Medications Ordered in UC Medications - No data to display  Initial Impression / Assessment and Plan / UC Course  I have reviewed the triage vital signs and the nursing notes.  Pertinent labs & imaging results that were available during my care of the patient were reviewed by me and considered in my medical decision making (see chart for details).    Patient speaking in full sentences without respiratory distress. Afebrile, however did have dayquil 3 hours PTA. No tachycardia, tachypnea. Lungs CTAB. COVID testing limited to inpatient. Given history and exam, will have patient self quarantine. Instructions on when to end quarantine, family member isolation discussed, and resources provided. Symptomatic treatment discussed. Return precautions given. Patient expresses understanding and agrees to plan.  Final Clinical Impressions(s) / UC Diagnoses   Final diagnoses:  Viral illness    ED Prescriptions    Medication Sig Dispense Auth. Provider   benzonatate (TESSALON) 100 MG capsule Take 1 capsule (100 mg total) by mouth every 8 (eight) hours. 21 capsule Coleson Kant V, PA-C   ipratropium (ATROVENT) 0.06 % nasal spray Place 2 sprays into both nostrils 4 (four) times daily. 15 mL Jaydeen Darley V, PA-C   fluticasone (FLONASE) 50 MCG/ACT nasal spray Place 2 sprays into both  nostrils daily. 1 g Threasa AlphaYu, Maresa Morash V, PA-C        Maddux First V, [place]PA-C 01/04/19 1516

## 2019-06-03 ENCOUNTER — Ambulatory Visit
Admission: EM | Admit: 2019-06-03 | Discharge: 2019-06-03 | Disposition: A | Discharge status: Home / Self Care | Payer: HRSA Program | Attending: Emergency Medicine | Admitting: Emergency Medicine

## 2019-06-03 ENCOUNTER — Encounter: Payer: Self-pay | Admitting: Emergency Medicine

## 2019-06-03 ENCOUNTER — Other Ambulatory Visit: Payer: Self-pay

## 2019-06-03 DIAGNOSIS — Z20822 Contact with and (suspected) exposure to covid-19: Secondary | ICD-10-CM

## 2019-06-03 DIAGNOSIS — Z20828 Contact with and (suspected) exposure to other viral communicable diseases: Secondary | ICD-10-CM

## 2019-06-03 DIAGNOSIS — R05 Cough: Secondary | ICD-10-CM | POA: Diagnosis not present

## 2019-06-03 DIAGNOSIS — R059 Cough, unspecified: Secondary | ICD-10-CM

## 2019-06-03 MED ORDER — BENZONATATE 100 MG PO CAPS: 100.0000 mg | ORAL_CAPSULE | Freq: Three times a day (TID) | ORAL | 0 refills | Status: DC

## 2019-06-03 MED ORDER — FLUTICASONE PROPIONATE 50 MCG/ACT NA SUSP: 2.0000 | Freq: Every day | NASAL | 0 refills | Status: DC

## 2019-06-03 MED ORDER — ONDANSETRON 4 MG PO TBDP: 4.0000 mg | ORAL_TABLET | Freq: Three times a day (TID) | ORAL | 0 refills | Status: DC | PRN

## 2019-06-03 NOTE — ED Triage Notes (Signed)
Pt presents with 2 days of cough, runny nose, headache, diarrhea, loss of appetite, sore throat, and bilateral ear pain.

## 2019-06-03 NOTE — ED Notes (Signed)
Patient able to ambulate independently  

## 2019-06-03 NOTE — ED Provider Notes (Signed)
[name]    CSN: [id number] Arrival date & time: 06/03/19  0830      History   Chief Complaint No chief complaint on file.   HPI Cristina Burke is a 28 y.o. female for 2-day course of non-productive, non-hemoptic cough, runny nose, frontal headache, loose stools, decreased appetite, sore throat, bilateral ear pain.  Patient states that a friend of hers underwent COVID testing: Does not have the results of that.  Patient has tried tea, lozenges, increasing water take "even though I'm not feel thirsty" with some relief.  Patient does note single episode of nausea last night with abdominal pain or emesis.  LMP 05/28/2019.  Past Medical History:  Diagnosis Date  . Medical history non-contributory   . No pertinent past medical history     There are no active problems to display for this patient.   Past Surgical History:  Procedure Laterality Date  . NO PAST SURGERIES    . TUBAL LIGATION Bilateral 07/03/2016   Procedure: POST PARTUM TUBAL LIGATION;  Surgeon: Woodroe Mode, MD;  Location: [facility];  Service: Gynecology;  Laterality: Bilateral;    OB History    Gravida  3   Para  3   Term  3   Preterm      AB      Living  3     SAB      TAB      Ectopic      Multiple  0   Live Births  3            Home Medications    Prior to Admission medications   Medication Sig Start Date End Date Taking? Authorizing Provider  benzonatate (TESSALON) 100 MG capsule Take 1 capsule (100 mg total) by mouth every 8 (eight) hours. 06/03/19   Hall-Potvin, [place], PA-C  FLUoxetine (PROZAC) 10 MG tablet Take 1 tablet (10 mg total) by mouth daily. 11/29/18   Robyn Haber, MD  fluticasone (FLONASE) 50 MCG/ACT nasal spray Place 2 sprays into both nostrils daily. 06/03/19   Hall-Potvin, [place], PA-C  ipratropium (ATROVENT) 0.06 % nasal spray Place 2 sprays into both nostrils 4 (four) times daily. 01/04/19   Tasia Catchings, Amy V, PA-C  ondansetron (ZOFRAN ODT) 4 MG  disintegrating tablet Take 1 tablet (4 mg total) by mouth every 8 (eight) hours as needed for nausea or vomiting. 06/03/19   Hall-Potvin, [place], PA-C    Family History Family History  Problem Relation Age of Onset  . Hypertension Mother   . Diabetes Mother   . Diabetes Brother   . Diabetes Sister     Social History Social History   Tobacco Use  . Smoking status: Never Smoker  . Smokeless tobacco: Never Used  Substance Use Topics  . Alcohol use: No  . Drug use: No     Allergies   Patient has no known allergies.   Review of Systems Review of Systems  Constitutional: Positive for appetite change. Negative for fatigue and fever.  HENT: Positive for ear pain, postnasal drip, rhinorrhea and sore throat. Negative for congestion, dental problem, facial swelling, hearing loss, sinus pain, trouble swallowing and voice change.   Eyes: Negative for photophobia, pain and visual disturbance.  Respiratory: Positive for cough. Negative for shortness of breath.   Cardiovascular: Negative for chest pain and palpitations.  Gastrointestinal: Positive for nausea. Negative for abdominal distention, abdominal pain, blood in stool, constipation, diarrhea and vomiting.  Musculoskeletal: Negative for arthralgias and myalgias.  Neurological:  Positive for headaches. Negative for dizziness, facial asymmetry, weakness, light-headedness and numbness.     Physical Exam Triage Vital Signs ED Triage Vitals [06/03/19 0852]  Enc Vitals Group     BP 112/75     Pulse Rate (!) 113     Resp 18     Temp 99.8 F (37.7 C)     Temp Source Oral     SpO2 98 %     Weight      Height      Head Circumference      Peak Flow      Pain Score 2     Pain Loc      Pain Edu?      Excl. in GC?    No data found.  Updated Vital Signs BP 112/75 (BP Location: Left Arm)   Pulse (!) 113   Temp 99.8 F (37.7 C) (Oral) Comment: Ibuprofen today  Resp 18   LMP 05/28/2019   SpO2 98%    Physical Exam  Constitutional:      General: She is not in acute distress.    Appearance: She is not toxic-appearing.  HENT:     Head: Normocephalic and atraumatic.     Jaw: There is normal jaw occlusion. No tenderness or pain on movement.     Right Ear: Hearing, tympanic membrane, ear canal and external ear normal. No tenderness. No mastoid tenderness.     Left Ear: Hearing, tympanic membrane, ear canal and external ear normal. No tenderness. No mastoid tenderness.     Nose: No nasal deformity, septal deviation or nasal tenderness.     Right Turbinates: Not swollen or pale.     Left Turbinates: Not swollen or pale.     Right Sinus: No maxillary sinus tenderness or frontal sinus tenderness.     Left Sinus: No maxillary sinus tenderness or frontal sinus tenderness.     Comments: Bilateral turbinate edema with mucosal pallor    Mouth/Throat:     Lips: Pink. No lesions.     Mouth: Mucous membranes are moist. No injury.     Pharynx: Oropharynx is clear. Uvula midline. No uvula swelling.     Comments: no tonsillar exudate or hypertrophy, cobblestoning present Eyes:     General: No scleral icterus.    Conjunctiva/sclera: Conjunctivae normal.     Pupils: Pupils are equal, round, and reactive to light.  Neck:     Musculoskeletal: Normal range of motion and neck supple. No muscular tenderness.  Cardiovascular:     Rate and Rhythm: Regular rhythm. Tachycardia present.     Heart sounds: No murmur. No gallop.      Comments: Patient's heart rate varies from 97-106 throughout visit with provider Pulmonary:     Effort: Pulmonary effort is normal. No respiratory distress.     Breath sounds: No wheezing or rhonchi.  Musculoskeletal: Normal range of motion.     Right lower leg: No edema.     Left lower leg: No edema.  Lymphadenopathy:     Cervical: No cervical adenopathy.  Skin:    General: Skin is warm.     Capillary Refill: Capillary refill takes less than 2 seconds.     Coloration: Skin is not jaundiced.      Findings: No rash.  Neurological:     General: No focal deficit present.     Mental Status: She is alert and oriented to person, place, and time.     Motor: No weakness.  Gait: Gait normal.     Deep Tendon Reflexes: Reflexes normal.      UC Treatments / Results  Labs (all labs ordered are listed, but only abnormal results are displayed) Labs Reviewed  NOVEL CORONAVIRUS, NAA    EKG   Radiology No results found.  Procedures Procedures (including critical care time)  Medications Ordered in UC Medications - No data to display  Initial Impression / Assessment and Plan / UC Course  I have reviewed the triage vital signs and the nursing notes.  Pertinent labs & imaging results that were available during my care of the patient were reviewed by me and considered in my medical decision making (see chart for details).     1.  Suspected given 19 virus infection Unconfirmed covid contact, though testing currently pending.  COVID testing obtained on patient today in office which he tolerated well.  Patient to quarantine until results are back.  Will treat supportively as outlined below.  No emesis, patient reporting good oral intake: Zofran given as she developed nausea last night as this provider anticipates nausea could worsen and oral intake is important in patient maintaining overall good status.  Return precautions discussed, patient verbalized understanding and is agreeable to plan. Final Clinical Impressions(s) / UC Diagnoses   Final diagnoses:  Suspected COVID-19 virus infection  Cough     Discharge Instructions     Your COVID test is pending: Is important you quarantine at home until your results are back. You may take OTC Tylenol for fever and myalgias.  It is important to drink plenty of water throughout the day to stay hydrated. If you test positive and later develop severe fever, cough, or shortness of breath, it is recommended that you go to the ER for  further evaluation.    ED Prescriptions    Medication Sig Dispense Auth. Provider   benzonatate (TESSALON) 100 MG capsule Take 1 capsule (100 mg total) by mouth every 8 (eight) hours. 21 capsule Hall-Potvin, [place], PA-C   fluticasone (FLONASE) 50 MCG/ACT nasal spray Place 2 sprays into both nostrils daily. 16 g Hall-Potvin, [place], PA-C   ondansetron (ZOFRAN ODT) 4 MG disintegrating tablet Take 1 tablet (4 mg total) by mouth every 8 (eight) hours as needed for nausea or vomiting. 21 tablet Hall-Potvin, [place], PA-C     PDMP not reviewed this encounter.   Odette Fraction [place], [place] 06/03/19 1915

## 2019-06-03 NOTE — Discharge Instructions (Addendum)
Your COVID test is pending: Is important you quarantine at home until your results are back. °You may take OTC Tylenol for fever and myalgias.  It is important to drink plenty of water throughout the day to stay hydrated. °If you test positive and later develop severe fever, cough, or shortness of breath, it is recommended that you go to the ER for further evaluation. °

## 2019-06-05 LAB — NOVEL CORONAVIRUS, NAA: SARS-CoV-2, NAA: DETECTED — AB

## 2019-06-06 ENCOUNTER — Telehealth (HOSPITAL_COMMUNITY): Payer: Self-pay | Admitting: Emergency Medicine

## 2019-06-06 NOTE — Telephone Encounter (Signed)
Patient contacted and made aware of positive covid  results, all questions answered Symptoms started 10/07

## 2019-06-06 NOTE — Telephone Encounter (Signed)
Positive Covid. Attempted to reach patient to notify her, No answer, Voicemail left.

## 2019-10-18 ENCOUNTER — Other Ambulatory Visit: Payer: Self-pay

## 2019-10-18 ENCOUNTER — Encounter: Payer: Self-pay | Admitting: Emergency Medicine

## 2019-10-18 ENCOUNTER — Emergency Department (HOSPITAL_COMMUNITY): Payer: BC Managed Care – PPO

## 2019-10-18 ENCOUNTER — Encounter (HOSPITAL_COMMUNITY): Payer: Self-pay | Admitting: Emergency Medicine

## 2019-10-18 ENCOUNTER — Emergency Department (HOSPITAL_COMMUNITY)
Admission: EM | Admit: 2019-10-18 | Discharge: 2019-10-18 | Disposition: A | Discharge status: Home / Self Care | Payer: BC Managed Care – PPO | Attending: Emergency Medicine | Admitting: Emergency Medicine

## 2019-10-18 ENCOUNTER — Ambulatory Visit (INDEPENDENT_AMBULATORY_CARE_PROVIDER_SITE_OTHER)
Admission: EM | Admit: 2019-10-18 | Discharge: 2019-10-18 | Disposition: A | Discharge status: Home / Self Care | Payer: BC Managed Care – PPO | Source: Home / Self Care

## 2019-10-18 DIAGNOSIS — Y999 Unspecified external cause status: Secondary | ICD-10-CM | POA: Diagnosis not present

## 2019-10-18 DIAGNOSIS — W19XXXA Unspecified fall, initial encounter: Secondary | ICD-10-CM

## 2019-10-18 DIAGNOSIS — Y93E1 Activity, personal bathing and showering: Secondary | ICD-10-CM | POA: Insufficient documentation

## 2019-10-18 DIAGNOSIS — M545 Low back pain, unspecified: Secondary | ICD-10-CM

## 2019-10-18 DIAGNOSIS — W010XXA Fall on same level from slipping, tripping and stumbling without subsequent striking against object, initial encounter: Secondary | ICD-10-CM | POA: Diagnosis not present

## 2019-10-18 DIAGNOSIS — R32 Unspecified urinary incontinence: Secondary | ICD-10-CM

## 2019-10-18 DIAGNOSIS — R339 Retention of urine, unspecified: Secondary | ICD-10-CM

## 2019-10-18 DIAGNOSIS — K5900 Constipation, unspecified: Secondary | ICD-10-CM | POA: Diagnosis not present

## 2019-10-18 DIAGNOSIS — Y929 Unspecified place or not applicable: Secondary | ICD-10-CM | POA: Diagnosis not present

## 2019-10-18 LAB — CBC WITH DIFFERENTIAL/PLATELET
Abs Immature Granulocytes: 0.01 10*3/uL (ref 0.00–0.07)
Basophils Absolute: 0 10*3/uL (ref 0.0–0.1)
Basophils Relative: 1 %
Eosinophils Absolute: 0.2 10*3/uL (ref 0.0–0.5)
Eosinophils Relative: 3 %
HCT: 37 % (ref 36.0–46.0)
Hemoglobin: 11.4 g/dL — ABNORMAL LOW (ref 12.0–15.0)
Immature Granulocytes: 0 %
Lymphocytes Relative: 32 %
Lymphs Abs: 1.9 10*3/uL (ref 0.7–4.0)
MCH: 25.1 pg — ABNORMAL LOW (ref 26.0–34.0)
MCHC: 30.8 g/dL (ref 30.0–36.0)
MCV: 81.3 fL (ref 80.0–100.0)
Monocytes Absolute: 0.4 10*3/uL (ref 0.1–1.0)
Monocytes Relative: 6 %
Neutro Abs: 3.5 10*3/uL (ref 1.7–7.7)
Neutrophils Relative %: 58 %
Platelets: 246 10*3/uL (ref 150–400)
RBC: 4.55 MIL/uL (ref 3.87–5.11)
RDW: 14.6 % (ref 11.5–15.5)
WBC: 6 10*3/uL (ref 4.0–10.5)
nRBC: 0 % (ref 0.0–0.2)

## 2019-10-18 LAB — BASIC METABOLIC PANEL
Anion gap: 9 (ref 5–15)
BUN: 23 mg/dL — ABNORMAL HIGH (ref 6–20)
CO2: 26 mmol/L (ref 22–32)
Calcium: 8.9 mg/dL (ref 8.9–10.3)
Chloride: 104 mmol/L (ref 98–111)
Creatinine, Ser: 0.65 mg/dL (ref 0.44–1.00)
GFR calc Af Amer: >60 mL/min (ref >60)
GFR calc non Af Amer: >60 mL/min (ref >60)
Glucose, Bld: 97 mg/dL (ref 70–99)
Potassium: 4.1 mmol/L (ref 3.5–5.1)
Sodium: 139 mmol/L (ref 135–145)

## 2019-10-18 LAB — I-STAT BETA HCG BLOOD, ED (MC, WL, AP ONLY): I-stat hCG, quantitative: <5 m[IU]/mL (ref <5)

## 2019-10-18 MED ORDER — ONDANSETRON HCL 4 MG/2ML IJ SOLN
4.0000 mg | Freq: Once | INTRAMUSCULAR | Status: AC
  Administered 2019-10-18: 4 mg via INTRAVENOUS
  Filled 2019-10-18: qty 2

## 2019-10-18 MED ORDER — MORPHINE SULFATE (PF) 4 MG/ML IV SOLN
4.0000 mg | Freq: Once | INTRAVENOUS | Status: AC
  Administered 2019-10-18: 4 mg via INTRAVENOUS
  Filled 2019-10-18: qty 1

## 2019-10-18 MED ORDER — POLYETHYLENE GLYCOL 3350 17 GM/SCOOP PO POWD: 1.0000 | Freq: Once | ORAL | 0 refills | Status: AC

## 2019-10-18 MED ORDER — DOCUSATE SODIUM 250 MG PO CAPS: 250.0000 mg | ORAL_CAPSULE | Freq: Every day | ORAL | 0 refills | Status: DC

## 2019-10-18 MED ORDER — CYCLOBENZAPRINE HCL 10 MG PO TABS: 10.0000 mg | ORAL_TABLET | Freq: Two times a day (BID) | ORAL | 0 refills | Status: DC | PRN

## 2019-10-18 MED ORDER — LIDOCAINE 5 % EX PTCH
1.0000 | MEDICATED_PATCH | Freq: Once | CUTANEOUS | Status: DC
  Administered 2019-10-18: 1 via TRANSDERMAL
  Filled 2019-10-18: qty 1

## 2019-10-18 MED ORDER — NAPROXEN 500 MG PO TABS: 500.0000 mg | ORAL_TABLET | Freq: Two times a day (BID) | ORAL | 0 refills | Status: AC

## 2019-10-18 NOTE — ED Notes (Signed)
Pt transported to MRI 

## 2019-10-18 NOTE — ED Triage Notes (Signed)
Pt states that she fell on Saturday, onto her tailbone, she reports difficulty bending over, ambulating and pain when she laughs or sneezes. She also endorses some loss of bladder control and constipation since the fall. Pt seen at urgent care and sent here for further evaluation. Denies numbness or tingling.

## 2019-10-18 NOTE — Discharge Instructions (Addendum)
Recommend you go ER for further evaluation.

## 2019-10-18 NOTE — ED Provider Notes (Signed)
[name]    CSN: [id number] Arrival date & time: 10/18/19  0845      History   Chief Complaint Chief Complaint  Patient presents with  . Fall  . Back Pain    HPI Cristina Burke is a 29 y.o. female presenting for persistent back pain status post fall 3 days ago.  She fell in the tub, hit her tailbone.  Denies head trauma, LOC.  Patient states pain and swelling has persisted, and has not alleviated with OTC analgesia.  Having difficulty walking.   States that she has been "leaking urine" since then without valsalva.  Patient also stating that she had significant abdominal distention with decreased urine output on Sunday.  Feels like this is slowly gotten better, though feels "like I need to go, but not all of it comes out".  Patient also feels has been more constipated since fall: Last BM was Sunday and without pain, blood or melena.  Denies saddle area anesthesia, lower extremity weakness, numbness, tingling.    Past Medical History:  Diagnosis Date  . Medical history non-contributory   . No pertinent past medical history     There are no problems to display for this patient.   Past Surgical History:  Procedure Laterality Date  . NO PAST SURGERIES    . TUBAL LIGATION Bilateral 07/03/2016   Procedure: POST PARTUM TUBAL LIGATION;  Surgeon: Adam Phenix, MD;  Location: [facility];  Service: Gynecology;  Laterality: Bilateral;    OB History    Gravida  3   Para  3   Term  3   Preterm      AB      Living  3     SAB      TAB      Ectopic      Multiple  0   Live Births  3            Home Medications    Prior to Admission medications   Medication Sig Start Date End Date Taking? Authorizing Provider  cyclobenzaprine (FLEXERIL) 10 MG tablet Take 1 tablet (10 mg total) by mouth 2 (two) times daily as needed for muscle spasms. 10/18/19   Albrizze, Kaitlyn E, PA-C  docusate sodium (COLACE) 250 MG capsule Take 1 capsule (250 mg total) by mouth  daily. 10/18/19   Albrizze, Kaitlyn E, PA-C  ibuprofen (ADVIL) 200 MG tablet Take 400 mg by mouth every 6 (six) hours as needed for headache, mild pain or moderate pain.    [provider]  naproxen (NAPROSYN) 500 MG tablet Take 1 tablet (500 mg total) by mouth 2 (two) times daily for 5 days. 10/18/19 10/23/19  Albrizze, Kaitlyn E, PA-C  polyethylene glycol powder (MIRALAX) 17 GM/SCOOP powder Take 255 g by mouth once for 1 dose. 10/18/19 10/18/19  Albrizze, Kaitlyn E, PA-C  FLUoxetine (PROZAC) 10 MG tablet Take 1 tablet (10 mg total) by mouth daily. 11/29/18 10/18/19  Elvina Sidle, MD  fluticasone (FLONASE) 50 MCG/ACT nasal spray Place 2 sprays into both nostrils daily. 06/03/19 10/18/19  Hall-Potvin, [place], PA-C  ipratropium (ATROVENT) 0.06 % nasal spray Place 2 sprays into both nostrils 4 (four) times daily. 01/04/19 10/18/19  Belinda Fisher, PA-C    Family History Family History  Problem Relation Age of Onset  . Hypertension Mother   . Diabetes Mother   . Diabetes Brother   . Diabetes Sister     Social History Social History   Tobacco Use  .  Smoking status: Never Smoker  . Smokeless tobacco: Never Used  Substance Use Topics  . Alcohol use: No  . Drug use: No     Allergies   Patient has no known allergies.   Review of Systems As per HPI   Physical Exam Triage Vital Signs ED Triage Vitals  Enc Vitals Group     BP      Pulse      Resp      Temp      Temp src      SpO2      Weight      Height      Head Circumference      Peak Flow      Pain Score      Pain Loc      Pain Edu?      Excl. in [place]?    No data found.  Updated Vital Signs BP 111/73 (BP Location: Left Arm)   Pulse 82   Temp 98.1 F (36.7 C) (Oral)   Resp 16   LMP 10/03/2019   SpO2 97%   Visual Acuity Right Eye Distance:   Left Eye Distance:   Bilateral Distance:    Right Eye Near:   Left Eye Near:    Bilateral Near:     Physical Exam Constitutional:      General: She is not in acute  distress. HENT:     Head: Normocephalic and atraumatic.  Eyes:     General: No scleral icterus.    Pupils: Pupils are equal, round, and reactive to light.  Cardiovascular:     Rate and Rhythm: Normal rate.  Pulmonary:     Effort: Pulmonary effort is normal. No respiratory distress.     Breath sounds: No wheezing.  Abdominal:     General: Bowel sounds are normal.     Tenderness: There is no abdominal tenderness.  Musculoskeletal:     Right lower leg: No edema.     Left lower leg: No edema.     Comments: Cervical and thoracic spines unremarkable.  Patient has low lumbar tenderness, exquisite sacral tenderness with swelling noted to affected area.  Pain worse with bending over.  Skin:    Coloration: Skin is not jaundiced or pale.  Neurological:     Mental Status: She is alert and oriented to person, place, and time.     Sensory: No sensory deficit.     Coordination: Coordination normal.     Gait: Gait abnormal.     Deep Tendon Reflexes: Reflexes normal.      UC Treatments / Results  Labs (all labs ordered are listed, but only abnormal results are displayed) Labs Reviewed - No data to display  EKG   Radiology DG Sacrum/Coccyx  Result Date: 10/18/2019 CLINICAL DATA:  Fall, tailbone pain EXAM: SACRUM AND COCCYX - 2+ VIEW COMPARISON:  None. FINDINGS: There is no evidence of fracture or other focal bone lesions. IMPRESSION: Negative. Electronically Signed   By: Kathreen Devoid   On: 10/18/2019 14:45   MR LUMBAR SPINE WO CONTRAST  Result Date: 10/18/2019 CLINICAL DATA:  Low back pain and urinary incontinence after fall 3 days ago EXAM: MRI LUMBAR SPINE WITHOUT CONTRAST TECHNIQUE: Multiplanar, multisequence MR imaging of the lumbar spine was performed. No intravenous contrast was administered. COMPARISON:  None. FINDINGS: Segmentation: Presumed standard anatomy with the inferior-most well developed disc space designated as L5-S1. Alignment:  Physiologic. Vertebrae:  No fracture,  evidence of discitis, or bone lesion.  Conus medullaris and cauda equina: Conus extends to the L1-L2 level. Conus and cauda equina appear normal. Paraspinal and other soft tissues: Negative. Disc levels: Intervertebral discs are well hydrated without disc desiccation or disc height loss. No focal disc protrusion. Unremarkable facet joints. No foraminal or canal stenosis at any level. T12-L1: Unremarkable. L1-L2: Unremarkable. L2-L3: Unremarkable. L3-L4: Unremarkable. L4-L5: Unremarkable. L5-S1: Unremarkable. IMPRESSION: Normal MRI of the lumbar spine. Electronically Signed   By: Duanne Guess D.O.   On: 10/18/2019 13:24    Procedures Procedures (including critical care time)  Medications Ordered in UC Medications - No data to display  Initial Impression / Assessment and Plan / UC Course  I have reviewed the triage vital signs and the nursing notes.  Pertinent labs & imaging results that were available during my care of the patient were reviewed by me and considered in my medical decision making (see chart for details).     Patient afebrile, nontoxic in office today.  Does appear to be in significant pain.  Most concerning is urinary retention (seems to be resolving) as well as persistent urinary incontinence in setting of lumbar injury swelling.  Patient referred to ER for further evaluation/management.  Electing to self transport with her sister who will drive.  Return precautions discussed, patient verbalized understanding and is agreeable to plan. Final Clinical Impressions(s) / UC Diagnoses   Final diagnoses:  Acute midline low back pain without sciatica  Urinary retention  Urinary incontinence, unspecified type  Constipation, unspecified constipation type  Fall, initial encounter     Discharge Instructions     Recommend you go ER for further evaluation.    ED Prescriptions    None     PDMP not reviewed this encounter.   Hall-Potvin, [place], [place] 10/18/19 2058

## 2019-10-18 NOTE — ED Notes (Signed)
Patient verbalizes understanding of discharge instructions. Opportunity for questioning and answers were provided. Armband removed by staff, pt discharged from ED to home with family 

## 2019-10-18 NOTE — ED Notes (Signed)
Patient able to ambulate independently  

## 2019-10-18 NOTE — ED Triage Notes (Addendum)
Pt presents to [facility] for assessment after she slipped getting out of the shower and fell straight on to her bottom without catching herself.  C/o tailbone pain and constipation since.  Pt does endorse that she believes she is leaking urine.

## 2019-10-18 NOTE — Discharge Instructions (Signed)
You have been seen today for tail bone pain. Please read and follow all provided instructions. Return to the emergency room for worsening condition or new concerning symptoms.    1. Medications:  -Prescription sent to your pharmacy for Flexeril.  This is a muscle relaxer.  You can take this at bedtime as it will make you drowsy.  He cannot work or drive while taking this medicine. -Prescription also sent to your pharmacy for naproxen.  This is an anti-inflammatory medication.  He should take it with food as it can cause upset stomach.  Do not take additional ibuprofen, Aleve, Motrin or Advil as these are all similar. -Prescription sent to your pharmacy for MiraLAX.  This is to help you have a bowel movement.  Is a laxative.  Please take as directed. -Prescription also sent for Colace.  This is a stool softener.  Please take as prescribed.  Continue usual home medications Take medications as prescribed. Please review all of the medicines and only take them if you do not have an allergy to them.   2. Treatment: rest, drink plenty of fluids  3. Follow Up:  Please follow up with primary care provider by scheduling an appointment as soon as possible for a visit  If you do not have a primary care physician, contact [facility] at [phone] for referral   It is also a possibility that you have an allergic reaction to any of the medicines that you have been prescribed - Everybody reacts differently to medications and while MOST people have no trouble with most medicines, you may have a reaction such as nausea, vomiting, rash, swelling, shortness of breath. If this is the case, please stop taking the medicine immediately and contact your physician.  ?

## 2019-10-18 NOTE — ED Provider Notes (Signed)
[facility] [facility] EMERGENCY DEPARTMENT Provider Note   CSN: [id number] Arrival date & time: 10/18/19  1039     History Chief Complaint  Patient presents with  . Fall    Cristina Burke is a 29 y.o. female with no known past medical history presents emergency department today with chief complaint of mechanical fall x3 days ago.  Patient states she was in the shower when she slipped and fell landing directly on her tailbone.  She denies hitting her head or loss of consciousness.  Her sister heard her fall and came directly to see her and help her up.  Patient states since the fall she is having severe pain located in her low back and tailbone area.  Pain is worse with ambulation, laughing or sneezing.  She is also endorsing urinary incontinence and constipation.  She does not have history of urinary incontinence.  She does have remote history of constipation but has not had any problems with this in over 1 year.  She took Advil for her pain last night with minimal symptom relief.  She rates the pain 8/10 in severity.  She went to urgent care prior to arrival and was sent here for further evaluation.  Denies fevers, weight loss, numbness/weakness of upper and lower extremities, urinary retention, history of cancer, saddle anesthesia, history of back surgery, history of IVDA.    Past Medical History:  Diagnosis Date  . Medical history non-contributory   . No pertinent past medical history     There are no problems to display for this patient.   Past Surgical History:  Procedure Laterality Date  . NO PAST SURGERIES    . TUBAL LIGATION Bilateral 07/03/2016   Procedure: POST PARTUM TUBAL LIGATION;  Surgeon: Woodroe Mode, MD;  Location: [facility];  Service: Gynecology;  Laterality: Bilateral;     OB History    Gravida  3   Para  3   Term  3   Preterm      AB      Living  3     SAB      TAB      Ectopic      Multiple  0   Live Births  3            Family History  Problem Relation Age of Onset  . Hypertension Mother   . Diabetes Mother   . Diabetes Brother   . Diabetes Sister     Social History   Tobacco Use  . Smoking status: Never Smoker  . Smokeless tobacco: Never Used  Substance Use Topics  . Alcohol use: No  . Drug use: No    Home Medications Prior to Admission medications   Medication Sig Start Date End Date Taking? Authorizing Provider  ibuprofen (ADVIL) 200 MG tablet Take 400 mg by mouth every 6 (six) hours as needed for headache, mild pain or moderate pain.   Yes [provider]  cyclobenzaprine (FLEXERIL) 10 MG tablet Take 1 tablet (10 mg total) by mouth 2 (two) times daily as needed for muscle spasms. 10/18/19   Maddelynn Moosman E, PA-C  docusate sodium (COLACE) 250 MG capsule Take 1 capsule (250 mg total) by mouth daily. 10/18/19   Inger Wiest E, PA-C  naproxen (NAPROSYN) 500 MG tablet Take 1 tablet (500 mg total) by mouth 2 (two) times daily for 5 days. 10/18/19 10/23/19  Dreshawn Hendershott E, PA-C  polyethylene glycol powder (MIRALAX) 17 GM/SCOOP powder Take 255 g  by mouth once for 1 dose. 10/18/19 10/18/19  Shaynna Husby E, PA-C  FLUoxetine (PROZAC) 10 MG tablet Take 1 tablet (10 mg total) by mouth daily. 11/29/18 10/18/19  Elvina Sidle, MD  fluticasone (FLONASE) 50 MCG/ACT nasal spray Place 2 sprays into both nostrils daily. 06/03/19 10/18/19  Hall-Potvin, [place], PA-C  ipratropium (ATROVENT) 0.06 % nasal spray Place 2 sprays into both nostrils 4 (four) times daily. 01/04/19 10/18/19  Belinda Fisher, PA-C    Allergies    Patient has no known allergies.  Review of Systems   Review of Systems  All other systems are reviewed and are negative for acute change except as noted in the HPI.   Physical Exam Updated Vital Signs BP 115/74 (BP Location: Left Arm)   Pulse 67   Temp 97.9 F (36.6 C) (Oral)   Resp 16   LMP 10/03/2019   SpO2 100%   Physical Exam Vitals and nursing note reviewed.   Constitutional:      General: She is not in acute distress.    Appearance: She is not ill-appearing.  HENT:     Head: Normocephalic and atraumatic.     Right Ear: Tympanic membrane and external ear normal.     Left Ear: Tympanic membrane and external ear normal.     Nose: Nose normal.     Mouth/Throat:     Mouth: Mucous membranes are moist.     Pharynx: Oropharynx is clear.  Eyes:     General: No scleral icterus.       Right eye: No discharge.        Left eye: No discharge.     Extraocular Movements: Extraocular movements intact.     Conjunctiva/sclera: Conjunctivae normal.     Pupils: Pupils are equal, round, and reactive to light.  Neck:     Vascular: No JVD.  Cardiovascular:     Rate and Rhythm: Normal rate and regular rhythm.     Pulses: Normal pulses.          Radial pulses are 2+ on the right side and 2+ on the left side.     Heart sounds: Normal heart sounds.  Pulmonary:     Comments: Lungs clear to auscultation in all fields. Symmetric chest rise. No wheezing, rales, or rhonchi. Abdominal:     Comments: Abdomen is soft, non-distended, and non-tender in all quadrants. No rigidity, no guarding. No peritoneal signs.  Musculoskeletal:        General: Normal range of motion.     Cervical back: Normal range of motion.     Comments: Full range of motion of the T-spine and L-spine No tenderness to palpation of the spinous processes of the T-spine or L-spine No crepitus, deformity or step-offs No tenderness to palpation of the paraspinous muscles of the L-spine    Tenderness to palpation of sacral area. No overlying skin changes  Skin:    General: Skin is warm and dry.     Capillary Refill: Capillary refill takes less than 2 seconds.  Neurological:     Mental Status: She is oriented to person, place, and time.     GCS: GCS eye subscore is 4. GCS verbal subscore is 5. GCS motor subscore is 6.     Comments: Fluent speech, no facial droop.  Mental Status:  Alert,  oriented, thought content appropriate, able to give a coherent history. Speech fluent without evidence of aphasia. Able to follow 2 step commands without difficulty.  Cranial Nerves:  II:  Peripheral visual fields grossly normal, pupils equal, round, reactive to light III,IV, VI: ptosis not present, extra-ocular motions intact bilaterally  V,VII: smile symmetric, facial light touch sensation equal VIII: hearing grossly normal to voice  X: uvula elevates symmetrically  XI: bilateral shoulder shrug symmetric and strong XII: midline tongue extension without fassiculations Motor:  Normal tone. 5/5 in upper and lower extremities bilaterally including strong and equal grip strength and dorsiflexion/plantar flexion Sensory: Pinprick and light touch normal in all extremities.  Deep Tendon Reflexes: 2+ and symmetric in the biceps and patella Cerebellar: normal finger-to-nose with bilateral upper extremities Gait: antalgic gait. Normal balance CV: distal pulses palpable throughout   Sensation grossly intact to light touch in the lower extremities bilaterally.  Decreased rectal tone on DRE. Exam chaperoned by RN Morrie Sheldon      Psychiatric:        Behavior: Behavior normal.       ED Results / Procedures / Treatments   Labs (all labs ordered are listed, but only abnormal results are displayed) Labs Reviewed  CBC WITH DIFFERENTIAL/PLATELET - Abnormal; Notable for the following components:      Result Value   Hemoglobin 11.4 (*)    MCH 25.1 (*)    All other components within normal limits  BASIC METABOLIC PANEL - Abnormal; Notable for the following components:   BUN 23 (*)    All other components within normal limits  I-STAT BETA HCG BLOOD, ED ([facility], [facility], [facility] ONLY)    EKG None  Radiology DG Sacrum/Coccyx  Result Date: 10/18/2019 CLINICAL DATA:  Fall, tailbone pain EXAM: SACRUM AND COCCYX - 2+ VIEW COMPARISON:  None. FINDINGS: There is no evidence of fracture or other focal bone lesions.  IMPRESSION: Negative. Electronically Signed   By: Elige Ko   On: 10/18/2019 14:45   MR LUMBAR SPINE WO CONTRAST  Result Date: 10/18/2019 CLINICAL DATA:  Low back pain and urinary incontinence after fall 3 days ago EXAM: MRI LUMBAR SPINE WITHOUT CONTRAST TECHNIQUE: Multiplanar, multisequence MR imaging of the lumbar spine was performed. No intravenous contrast was administered. COMPARISON:  None. FINDINGS: Segmentation: Presumed standard anatomy with the inferior-most well developed disc space designated as L5-S1. Alignment:  Physiologic. Vertebrae:  No fracture, evidence of discitis, or bone lesion. Conus medullaris and cauda equina: Conus extends to the L1-L2 level. Conus and cauda equina appear normal. Paraspinal and other soft tissues: Negative. Disc levels: Intervertebral discs are well hydrated without disc desiccation or disc height loss. No focal disc protrusion. Unremarkable facet joints. No foraminal or canal stenosis at any level. T12-L1: Unremarkable. L1-L2: Unremarkable. L2-L3: Unremarkable. L3-L4: Unremarkable. L4-L5: Unremarkable. L5-S1: Unremarkable. IMPRESSION: Normal MRI of the lumbar spine. Electronically Signed   By: Duanne Guess D.O.   On: 10/18/2019 13:24    Procedures Procedures (including critical care time)  Medications Ordered in ED Medications  lidocaine (LIDODERM) 5 % 1 patch (1 patch Transdermal Patch Applied 10/18/19 1424)  morphine 4 MG/ML injection 4 mg (4 mg Intravenous Given 10/18/19 1144)  ondansetron (ZOFRAN) injection 4 mg (4 mg Intravenous Given 10/18/19 1144)    ED Course  I have reviewed the triage vital signs and the nursing notes.  Pertinent labs & imaging results that were available during my care of the patient were reviewed by me and considered in my medical decision making (see chart for details).     MDM Rules/Calculators/A&P  Patient seen and examined. Patient presents awake, alert, hemodynamically stable, afebrile, non  toxic. On exam she has antalgic gait, walking at slow space.  She has tenderness over her tailbone, no other midline tenderness on her back.  Sensation intact to bilateral lower extremities.  No weakness noted.  She does have decreased rectal tone on DRE.  Question if this is related to pain.  Given concerning symptoms of urinary incontinence, constipation and decreased rectal tone cauda equina is in the DDX. Basic labs obtained. BMP without significant findings.  CBC hemoglobin 11.4 which appears consistent with her baseline.  Pregnancy test negative.  Xray of coccyx is negative for fracture. MRI lumbar spine is also negative for cauda equina or other acute findings. Patient given morphine for pain and on reassessment reports mild improvement in pain. Engaged in shared decision making and patient feels comfortable being discharge home with symptomatic care. Will prescribe miralax and colace to help with constipation as well as muscle relaxer. Will avoid narcotics given she already has symptoms of constipation.  The patient appears reasonably screened and/or stabilized for discharge and I doubt any other medical condition or other [facility] requiring further screening, evaluation, or treatment in the ED at this time prior to discharge. The patient is safe for discharge with strict return precautions discussed. Recommend pcp follow up if symptoms persist. Findings and plan of care discussed with supervising physician Dr. Clarene Duke.   Portions of this note were generated with Scientist, clinical (histocompatibility and immunogenetics). Dictation errors may occur despite best attempts at proofreading.   Final Clinical Impression(s) / ED Diagnoses Final diagnoses:  Fall, initial encounter    Rx / DC Orders ED Discharge Orders         Ordered    cyclobenzaprine (FLEXERIL) 10 MG tablet  2 times daily PRN     10/18/19 1511    naproxen (NAPROSYN) 500 MG tablet  2 times daily     10/18/19 1511    polyethylene glycol powder (MIRALAX) 17 GM/SCOOP  powder   Once     10/18/19 1511    docusate sodium (COLACE) 250 MG capsule  Daily     10/18/19 1511           Sherene Sires, PA-C 10/18/19 1631    Little, Ambrose [place], MD 10/19/19 0830

## 2020-03-06 ENCOUNTER — Encounter: Payer: Self-pay | Admitting: Physician Assistant

## 2020-03-06 ENCOUNTER — Ambulatory Visit
Admission: EM | Admit: 2020-03-06 | Discharge: 2020-03-06 | Disposition: A | Discharge status: Home / Self Care | Payer: BC Managed Care – PPO | Attending: Emergency Medicine | Admitting: Emergency Medicine

## 2020-03-06 ENCOUNTER — Other Ambulatory Visit: Payer: Self-pay

## 2020-03-06 ENCOUNTER — Encounter: Payer: Self-pay | Admitting: Emergency Medicine

## 2020-03-06 DIAGNOSIS — R3915 Urgency of urination: Secondary | ICD-10-CM

## 2020-03-06 DIAGNOSIS — R1031 Right lower quadrant pain: Secondary | ICD-10-CM | POA: Diagnosis not present

## 2020-03-06 LAB — POCT URINALYSIS DIP (MANUAL ENTRY)
Bilirubin, UA: NEGATIVE
Blood, UA: NEGATIVE
Glucose, UA: NEGATIVE mg/dL
Ketones, POC UA: NEGATIVE mg/dL
Leukocytes, UA: NEGATIVE
Nitrite, UA: NEGATIVE
Protein Ur, POC: NEGATIVE mg/dL
Spec Grav, UA: >=1.03 — AB (ref 1.010–1.025)
Urobilinogen, UA: 0.2 E.U./dL
pH, UA: 5.5 (ref 5.0–8.0)

## 2020-03-06 LAB — POCT URINE PREGNANCY: Preg Test, Ur: NEGATIVE

## 2020-03-06 MED ORDER — ONDANSETRON 4 MG PO TBDP: 4.0000 mg | ORAL_TABLET | Freq: Three times a day (TID) | ORAL | 0 refills | Status: DC | PRN

## 2020-03-06 NOTE — Discharge Instructions (Signed)
Go to ER for worsening pain, vomiting, fever.

## 2020-03-06 NOTE — ED Triage Notes (Signed)
Pt presents to [facility] for assessment of RLQ pain starting Sunday, much worse then, resolving at this time.  Patient states every time she has this pain she feels like she has to go to the bathroom.  Denies vomiting, c/o nausea.

## 2020-03-06 NOTE — ED Provider Notes (Signed)
[name]    CSN: [id number] Arrival date & time: 03/06/20  1338      History   Chief Complaint Chief Complaint  Patient presents with  . Abdominal Pain    HPI Cristina Burke is a 29 y.o. female presenting for waxing, waning RLQ pain.  States this flare has been since Sunday: States she has had this numerous times in the past.  Last episode was a month ago.  Endorsing some nausea, no vomiting.  Denies fever, arthralgias, myalgias.  No chest pain, cough, difficulty breathing.  Denies change in urination or bowel habit.  States that pain will make her feel like she needs to urinate.   Past Medical History:  Diagnosis Date  . Medical history non-contributory   . No pertinent past medical history     There are no problems to display for this patient.   Past Surgical History:  Procedure Laterality Date  . NO PAST SURGERIES    . TUBAL LIGATION Bilateral 07/03/2016   Procedure: POST PARTUM TUBAL LIGATION;  Surgeon: Adam Phenix, MD;  Location: [facility];  Service: Gynecology;  Laterality: Bilateral;    OB History    Gravida  3   Para  3   Term  3   Preterm      AB      Living  3     SAB      TAB      Ectopic      Multiple  0   Live Births  3            Home Medications    Prior to Admission medications   Medication Sig Start Date End Date Taking? Authorizing Provider  ondansetron (ZOFRAN ODT) 4 MG disintegrating tablet Take 1 tablet (4 mg total) by mouth every 8 (eight) hours as needed for nausea or vomiting. 03/06/20   Hall-Potvin, [place], PA-C  FLUoxetine (PROZAC) 10 MG tablet Take 1 tablet (10 mg total) by mouth daily. 11/29/18 10/18/19  Elvina Sidle, MD  fluticasone (FLONASE) 50 MCG/ACT nasal spray Place 2 sprays into both nostrils daily. 06/03/19 10/18/19  Hall-Potvin, [place], PA-C  ipratropium (ATROVENT) 0.06 % nasal spray Place 2 sprays into both nostrils 4 (four) times daily. 01/04/19 10/18/19  Belinda Fisher, PA-C    Family  History Family History  Problem Relation Age of Onset  . Hypertension Mother   . Diabetes Mother   . Diabetes Brother   . Diabetes Sister     Social History Social History   Tobacco Use  . Smoking status: Never Smoker  . Smokeless tobacco: Never Used  Substance Use Topics  . Alcohol use: No  . Drug use: No     Allergies   Patient has no known allergies.   Review of Systems As per HPI   Physical Exam Triage Vital Signs ED Triage Vitals [03/06/20 1348]  Enc Vitals Group     BP 133/80     Pulse Rate 90     Resp 18     Temp 98 F (36.7 C)     Temp Source Oral     SpO2 98 %     Weight      Height      Head Circumference      Peak Flow      Pain Score      Pain Loc      Pain Edu?      Excl. in GC?    No data  found.  Updated Vital Signs BP 133/80 (BP Location: Left Arm)   Pulse 90   Temp 98 F (36.7 C) (Oral)   Resp 18   LMP 02/19/2020   SpO2 98%   Visual Acuity Right Eye Distance:   Left Eye Distance:   Bilateral Distance:    Right Eye Near:   Left Eye Near:    Bilateral Near:     Physical Exam Constitutional:      General: She is not in acute distress. HENT:     Head: Normocephalic and atraumatic.  Eyes:     General: No scleral icterus.    Pupils: Pupils are equal, round, and reactive to light.  Cardiovascular:     Rate and Rhythm: Normal rate.  Pulmonary:     Effort: Pulmonary effort is normal.  Abdominal:     General: Bowel sounds are normal.     Tenderness: There is abdominal tenderness. There is no right CVA tenderness, left CVA tenderness or guarding. Positive signs include McBurney's sign. Negative signs include Murphy's sign, Rovsing's sign and obturator sign.     Hernia: No hernia is present.    Skin:    Coloration: Skin is not jaundiced or pale.  Neurological:     Mental Status: She is alert and oriented to person, place, and time.      UC Treatments / Results  Labs (all labs ordered are listed, but only abnormal  results are displayed) Labs Reviewed  POCT URINALYSIS DIP (MANUAL ENTRY) - Abnormal; Notable for the following components:      Result Value   Spec Grav, UA >=1.030 (*)    All other components within normal limits  POCT URINE PREGNANCY    EKG   Radiology No results found.  Procedures Procedures (including critical care time)  Medications Ordered in UC Medications - No data to display  Initial Impression / Assessment and Plan / UC Course  I have reviewed the triage vital signs and the nursing notes.  Pertinent labs & imaging results that were available during my care of the patient were reviewed by me and considered in my medical decision making (see chart for details).     Patient afebrile, nontoxic in office today.  Urine pregnancy negative, urine dipstick with elevated specific gravity.  No signs/symptoms of acute abdomen.  Given patient's history of chronic constipation, GI symptoms, will have her follow-up with gastroenterology.  We will keep symptom log and treat supportively in the interim.  Return precautions discussed, patient verbalized understanding and is agreeable to plan. Final Clinical Impressions(s) / UC Diagnoses   Final diagnoses:  Urinary urgency  Colicky RLQ abdominal pain     Discharge Instructions     Go to ER for worsening pain, vomiting, fever.    ED Prescriptions    Medication Sig Dispense Auth. Provider   ondansetron (ZOFRAN ODT) 4 MG disintegrating tablet Take 1 tablet (4 mg total) by mouth every 8 (eight) hours as needed for nausea or vomiting. 21 tablet Hall-Potvin, [place], PA-C     PDMP not reviewed this encounter.   Hall-Potvin, [place], [place] 03/06/20 1453

## 2020-04-16 ENCOUNTER — Ambulatory Visit (INDEPENDENT_AMBULATORY_CARE_PROVIDER_SITE_OTHER): Payer: BC Managed Care – PPO | Admitting: Physician Assistant

## 2020-04-16 ENCOUNTER — Other Ambulatory Visit (INDEPENDENT_AMBULATORY_CARE_PROVIDER_SITE_OTHER): Payer: BC Managed Care – PPO

## 2020-04-16 ENCOUNTER — Encounter: Payer: Self-pay | Admitting: Physician Assistant

## 2020-04-16 VITALS — BP 98/62 | HR 81 | Ht 61.0 in | Wt 161.0 lb

## 2020-04-16 DIAGNOSIS — R1030 Lower abdominal pain, unspecified: Secondary | ICD-10-CM | POA: Diagnosis not present

## 2020-04-16 DIAGNOSIS — R109 Unspecified abdominal pain: Secondary | ICD-10-CM

## 2020-04-16 LAB — CBC WITH DIFFERENTIAL/PLATELET
Basophils Absolute: 0 10*3/uL (ref 0.0–0.1)
Basophils Relative: 0.5 % (ref 0.0–3.0)
Eosinophils Absolute: 0.2 10*3/uL (ref 0.0–0.7)
Eosinophils Relative: 3.9 % (ref 0.0–5.0)
HCT: 37.3 % (ref 36.0–46.0)
Hemoglobin: 12.1 g/dL (ref 12.0–15.0)
Lymphocytes Relative: 24.1 % (ref 12.0–46.0)
Lymphs Abs: 1.4 10*3/uL (ref 0.7–4.0)
MCHC: 32.5 g/dL (ref 30.0–36.0)
MCV: 81 fl (ref 78.0–100.0)
Monocytes Absolute: 0.4 10*3/uL (ref 0.1–1.0)
Monocytes Relative: 7.2 % (ref 3.0–12.0)
Neutro Abs: 3.8 10*3/uL (ref 1.4–7.7)
Neutrophils Relative %: 64.3 % (ref 43.0–77.0)
Platelets: 220 10*3/uL (ref 150.0–400.0)
RBC: 4.61 Mil/uL (ref 3.87–5.11)
RDW: 15.5 % (ref 11.5–15.5)
WBC: 5.9 10*3/uL (ref 4.0–10.5)

## 2020-04-16 LAB — BASIC METABOLIC PANEL
BUN: 21 mg/dL (ref 6–23)
CO2: 27 mEq/L (ref 19–32)
Calcium: 8.8 mg/dL (ref 8.4–10.5)
Chloride: 104 mEq/L (ref 96–112)
Creatinine, Ser: 0.83 mg/dL (ref 0.40–1.20)
GFR: 81.12 mL/min (ref >60.00)
Glucose, Bld: 98 mg/dL (ref 70–99)
Potassium: 3.8 mEq/L (ref 3.5–5.1)
Sodium: 139 mEq/L (ref 135–145)

## 2020-04-16 LAB — SEDIMENTATION RATE: Sed Rate: 28 mm/hr — ABNORMAL HIGH (ref 0–20)

## 2020-04-16 LAB — C-REACTIVE PROTEIN: CRP: <1 mg/dL (ref 0.5–20.0)

## 2020-04-16 MED ORDER — GLYCOPYRROLATE 2 MG PO TABS: 2.0000 mg | ORAL_TABLET | Freq: Two times a day (BID) | ORAL | 2 refills | Status: DC | PRN

## 2020-04-16 NOTE — Patient Instructions (Signed)
If you are age 29 or older, your body mass index should be between 23-30. Your Body mass index is 30.42 kg/m. If this is out of the aforementioned range listed, please consider follow up with your Primary Care Provider.  If you are age 78 or younger, your body mass index should be between 19-25. Your Body mass index is 30.42 kg/m. If this is out of the aformentioned range listed, please consider follow up with your Primary Care Provider.   Your provider has requested that you go to the basement level for lab work before leaving today. Press "B" on the elevator. The lab is located at the first door on the left as you exit the elevator.  You have been scheduled for a CT scan of the abdomen and pelvis at [facility] ([address]---this is in the same building as [employer]).   You are scheduled on 04/23/20 at 9:00 am. You should arrive 15 minutes prior to your appointment time for registration. Please follow the written instructions below on the day of your exam:  WARNING: IF YOU ARE ALLERGIC TO IODINE/X-RAY DYE, PLEASE NOTIFY RADIOLOGY IMMEDIATELY AT [phone]! YOU WILL BE GIVEN A 13 HOUR PREMEDICATION PREP.  1) Do not eat or drink anything after 5:00 am (4 hours prior to your test) 2) You have been given 2 bottles of oral contrast to drink. The solution may taste better if refrigerated, but do NOT add ice or any other liquid to this solution. Shake well before drinking.    Drink 1 bottle of contrast @ 7:00 am (2 hours prior to your exam)  Drink 1 bottle of contrast @ 8:00 am (1 hour prior to your exam)  You may take any medications as prescribed with a small amount of water, if necessary. If you take any of the following medications: METFORMIN, GLUCOPHAGE, GLUCOVANCE, AVANDAMET, RIOMET, FORTAMET, [place] MET, JANUMET, GLUMETZA or METAGLIP, you MAY be asked to HOLD this medication 48 hours AFTER the exam.  The purpose of you drinking the oral contrast is to aid in  the visualization of your intestinal tract. The contrast solution may cause some diarrhea. Depending on your individual set of symptoms, you may also receive an intravenous injection of x-ray contrast/dye. Plan on being at [facility] for 30 minutes or longer, depending on the type of exam you are having performed.  This test typically takes 30-45 minutes to complete.  If you have any questions regarding your exam or if you need to reschedule, you may call the CT department at [phone] between the hours of 8:00 am and 5:00 pm, Monday-Friday.  _______________________________________________________________________  START Glycopyrrolate 2 mg 1 tablet twice daily as needed for abdominal cramping  Please be sure to schedule an appointment with you GYN.  Follow up pending the results of your CT and labs.

## 2020-04-16 NOTE — Progress Notes (Signed)
Subjective:    Patient ID: Cristina Burke, female    DOB: 1991-08-12, 29 y.o.   MRN: [id number]  HPI Cristina Burke is a pleasant 29 year old female, new to GI today and referred by [facility] health urgent care/Brittany Hall/[place] PA-C after recent visit there with abdominal pain. Patient has not had any prior GI evaluation.  She says her current symptoms have been present over the past year and are generally intermittent.  She will have lower abdominal pain bilaterally, sometimes more noticeable on the right.  She says the pain is generally associated with an urge to urinate and at times will even have leakage of urine.  Symptoms seem to occur within the week or so prior to her menses and will last for at least a week.  Generally resolves around the time of her menstrual period She describes significant abdominal cramping and discomfort.  Her bowel movements have been regular, no melena or hematochezia.  She says her appetite is decreased if she is in a lot of pain but otherwise is okay.  She has some nausea when she is feeling poorly as well, no vomiting.  No change in her bowel habits during these episodes no significant issues with constipation or diarrhea, no rectal bleeding. She is status post bilateral tubal ligation, has not had any recent GYN evaluation. Family history is negative for GI disease as far she is aware. No recent imaging, UA negative at the time of urgent care visit.  Review of Systems Pertinent positive and negative review of systems were noted in the above HPI section.  All other review of systems was otherwise negative.  Outpatient Encounter Medications as of 04/16/2020  Medication Sig  . [DISCONTINUED] ondansetron (ZOFRAN ODT) 4 MG disintegrating tablet Take 1 tablet (4 mg total) by mouth every 8 (eight) hours as needed for nausea or vomiting.  Marland Kitchen glycopyrrolate (ROBINUL) 2 MG tablet Take 1 tablet (2 mg total) by mouth 2 (two) times daily as needed (abd cramping).  . [DISCONTINUED]  FLUoxetine (PROZAC) 10 MG tablet Take 1 tablet (10 mg total) by mouth daily.  . [DISCONTINUED] fluticasone (FLONASE) 50 MCG/ACT nasal spray Place 2 sprays into both nostrils daily.  . [DISCONTINUED] ipratropium (ATROVENT) 0.06 % nasal spray Place 2 sprays into both nostrils 4 (four) times daily.   No facility-administered encounter medications on file as of 04/16/2020.   No Known Allergies There are no problems to display for this patient.  Social History   Socioeconomic History  . Marital status: Single    Spouse name: Not on file  . Number of children: Not on file  . Years of education: Not on file  . Highest education level: Not on file  Occupational History  . Not on file  Tobacco Use  . Smoking status: Never Smoker  . Smokeless tobacco: Never Used  Substance and Sexual Activity  . Alcohol use: No  . Drug use: No  . Sexual activity: Yes    Birth control/protection: None  Other Topics Concern  . Not on file  Social History Narrative  . Not on file   Social Determinants of Health   Financial Resource Strain:   . Difficulty of Paying Living Expenses: Not on file  Food Insecurity:   . Worried About Charity fundraiser in the Last Year: Not on file  . Ran Out of Food in the Last Year: Not on file  Transportation Needs:   . Lack of Transportation (Medical): Not on file  . Lack of Transportation (  Non-Medical): Not on file  Physical Activity:   . Days of Exercise per Week: Not on file  . Minutes of Exercise per Session: Not on file  Stress:   . Feeling of Stress : Not on file  Social Connections:   . Frequency of Communication with Friends and Family: Not on file  . Frequency of Social Gatherings with Friends and Family: Not on file  . Attends Religious Services: Not on file  . Active Member of Clubs or Organizations: Not on file  . Attends Archivist Meetings: Not on file  . Marital Status: Not on file  Intimate Partner Violence:   . Fear of Current or  Ex-Partner: Not on file  . Emotionally Abused: Not on file  . Physically Abused: Not on file  . Sexually Abused: Not on file    Ms. Topp's family history includes Diabetes in her brother, mother, and sister; Hypertension in her mother.      Objective:    Vitals:   04/16/20 0838  BP: 98/62  Pulse: 81    Physical Exam Well-developed well-nourished young female  in no acute distress.  Height, Weight,161 BMI 30.4  HEENT; nontraumatic normocephalic, EOMI, PE RR LA, sclera anicteric. Oropharynx; not done Neck; supple, no JVD Cardiovascular; regular rate and rhythm with S1-S2, no murmur rub or gallop Pulmonary; Clear bilaterally Abdomen; soft, there is some mild bilateral lower quadrant tenderness, nondistended, no palpable mass or hepatosplenomegaly, bowel sounds are active Rectal; not done today Skin; benign exam, no jaundice rash or appreciable lesions Extremities; no clubbing cyanosis or edema skin warm and dry Neuro/Psych; alert and oriented x4, grossly nonfocal mood and affect appropriate       Assessment & Plan:   #21 29 year old female with 1 year history of recurring lower abdominal pain which can be bilateral or more notable on the right.  Symptoms usually lasting for at least a week and described as significant cramping and discomfort.  Symptoms usually occurring within 7 to 10 days before menses and improving during the time of menses.  Symptoms also generally associated with urgency for more frequent urination and occasional leakage  Etiology of patient's symptoms is not clear, I suspect there may be an underlying gynecological issue, consider endometriosis. Possible IBS or IBD.  #2 status post bilateral tubal ligation  Plan; CBC with differential, be met, sed rate, CRP. Schedule for CT of the abdomen and pelvis with contrast. Start trial of glycopyrrolate 2 mg p.o. twice daily during episodes of cramping/pain. Advised patient to obtain a GYN appointment for  evaluation. Further recommendations pending findings of labs and CT. Patient will be established with Dr. Orland Dec PA-C 04/16/2020   Cc: No ref. provider found

## 2020-04-16 NOTE — Progress Notes (Signed)
Agree with the assessment and plan as outlined by Amy Esterwood, PA-C.  Jeremy Mclamb, DO, FACG  

## 2020-04-23 ENCOUNTER — Ambulatory Visit (INDEPENDENT_AMBULATORY_CARE_PROVIDER_SITE_OTHER)
Admission: RE | Admit: 2020-04-23 | Discharge: 2020-04-23 | Disposition: A | Discharge status: Home / Self Care | Payer: BC Managed Care – PPO | Source: Ambulatory Visit | Attending: Physician Assistant | Admitting: Physician Assistant

## 2020-04-23 ENCOUNTER — Other Ambulatory Visit: Payer: Self-pay

## 2020-04-23 DIAGNOSIS — R1030 Lower abdominal pain, unspecified: Secondary | ICD-10-CM

## 2020-04-23 DIAGNOSIS — R109 Unspecified abdominal pain: Secondary | ICD-10-CM | POA: Diagnosis not present

## 2020-04-23 MED ORDER — IOHEXOL 300 MG/ML  SOLN
100.0000 mL | Freq: Once | INTRAMUSCULAR | Status: AC | PRN
  Administered 2020-04-23: 100 mL via INTRAVENOUS

## 2020-08-14 ENCOUNTER — Encounter: Payer: BC Managed Care – PPO | Admitting: Student

## 2020-08-15 ENCOUNTER — Ambulatory Visit (INDEPENDENT_AMBULATORY_CARE_PROVIDER_SITE_OTHER): Payer: BC Managed Care – PPO | Admitting: Internal Medicine

## 2020-08-15 ENCOUNTER — Encounter: Payer: Self-pay | Admitting: Internal Medicine

## 2020-08-15 VITALS — BP 123/66 | HR 71 | Temp 98.2°F | Ht 61.0 in | Wt 165.7 lb

## 2020-08-15 DIAGNOSIS — E669 Obesity, unspecified: Secondary | ICD-10-CM | POA: Diagnosis not present

## 2020-08-15 DIAGNOSIS — D509 Iron deficiency anemia, unspecified: Secondary | ICD-10-CM | POA: Diagnosis not present

## 2020-08-15 DIAGNOSIS — R251 Tremor, unspecified: Secondary | ICD-10-CM | POA: Diagnosis not present

## 2020-08-15 DIAGNOSIS — Z6831 Body mass index (BMI) 31.0-31.9, adult: Secondary | ICD-10-CM

## 2020-08-15 DIAGNOSIS — E611 Iron deficiency: Secondary | ICD-10-CM

## 2020-08-15 LAB — POCT GLYCOSYLATED HEMOGLOBIN (HGB A1C): Hemoglobin A1C: 5.5 % (ref 4.0–5.6)

## 2020-08-15 LAB — GLUCOSE, CAPILLARY: Glucose-Capillary: 92 mg/dL (ref 70–99)

## 2020-08-15 MED ORDER — BLOOD GLUCOSE MONITOR KIT: PACK | 0 refills | Status: DC

## 2020-08-15 NOTE — Assessment & Plan Note (Addendum)
Shakiness: Cristina Burke states that for the past year she has been having sensation of shakiness and feeling of intense hunger.  However last Friday at around 1 PM she became dizzy, anxious, started shaking and sensation of severe hunger/started.  She states that these episodes sometimes subsides when she ate a candy where she would come down a little bit however the shakiness will resume.  She also reports of feeling pressure in her head.  These episodes are not related to diet and she is not able to properly tell me if it is preprandial or postprandial.  She states that Friday when her episode happened she has had coffee and bread from [employer].  She states that one of her friends had the same symptoms and was told that she had hypoglycemia and she believes her symptoms are due to hypoglycemia.  She denies any cardiac or neurological symptoms whatsoever.  Denies chest pain, shortness of breath, nausea, vomiting, palpitation or focal weakness.  She is an avid Product manager and reports that she used to take BCCA nutritional supplement and magnesium however she has stopped taking them since her symptoms began.  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  On physical exams, she had complete neurological exam with no evidence of focal neurological weakness.  Gait was unremarkable.  Assessment and plan: Etiology of her shakiness is unclear presently.  It could be secondary to over-the-counter supplements.  Could also be due to possible hypoglycemia even though her capillary glucose and hemoglobin A1c was unremarkable.  I have advised patient to pick up a glucose meter kit and check fasting glucose and also when these symptoms happen.  She is advised to write down her glucose numbers and to give [place] a call when she has episode of hypoglycemia with capillary blood glucose less than 55 mg/dL.  If there is confirmed hypoglycemia, then we can proceed  with checking insulin level, C-peptide, proinsulin, sulfonylurea/meglitinides screening, beta hydroxybutyrate.  Also, I am not entirely sure if this could be secondary to her microcytic anemia and will obtain CBC, ferritin, TIBC  ADDENDUM:  Labs consistent with iron deficiency.  Will start patient on Nu-Iron.

## 2020-08-15 NOTE — Progress Notes (Signed)
   CC: Shaking sensation, starting, establish care  HPI:  Ms.Cristina Burke is a 29 y.o. with medical history significant for microcytic anemia presented to establish care.  Please see problem based charting for further details.  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 Social history: Born and raised in [place] however has been in [place] area for a while.  She works out consistently via weight lifting.  She is in a monogamous relationship, sexually active.  Occasionally drinks alcohol, denies cigarette, or illicit drug use.  Past Medical History:  Diagnosis Date  . Medical history non-contributory   . No pertinent past medical history    Review of Systems:   Review of Systems  Constitutional: Negative for chills, diaphoresis, fever, malaise/fatigue and weight loss.       Shakiness, dizziness, anxious, starvation  HENT: Negative.   Eyes: Negative.   Cardiovascular: Negative.   Gastrointestinal: Negative.   Genitourinary: Negative.   Musculoskeletal: Negative.   Skin: Negative.   Neurological: Negative.   Endo/Heme/Allergies: Negative.   Psychiatric/Behavioral: Negative.     Physical Exam:  Vitals:   08/15/20 0930  BP: 123/66  Pulse: 71  Temp: 98.2 F (36.8 C)  TempSrc: Oral  SpO2: 100%  Weight: 165 lb 11.2 oz (75.2 kg)  Height: 5\' 1"  (1.549 m)   Physical Exam Vitals and nursing note reviewed.  Constitutional:      General: She is not in acute distress.    Appearance: Normal appearance. She is not ill-appearing or diaphoretic.  HENT:     Head: Normocephalic and atraumatic.     Right Ear: External ear normal.     Left Ear: External ear normal.     Mouth/Throat:     Mouth: Mucous membranes are moist.  Eyes:     General: No scleral icterus.       Right eye: No discharge.        Left eye: No discharge.     Extraocular Movements: Extraocular movements intact.      Conjunctiva/sclera: Conjunctivae normal.  Cardiovascular:     Rate and Rhythm: Normal rate.     Pulses: Normal pulses.     Heart sounds: Normal heart sounds.  Pulmonary:     Effort: Pulmonary effort is normal. No respiratory distress.     Breath sounds: Normal breath sounds.  Abdominal:     General: Bowel sounds are normal.  Musculoskeletal:        General: No swelling. Normal range of motion.     Right lower leg: No edema.     Left lower leg: No edema.  Neurological:     Mental Status: She is alert.     Comments: Neurologic exam: Mental status: A&Ox3 Cranial Nerves: III, IV, VI: Extra-occular motions intact bilaterally V, VII: Face symmetric, sensation intact in all 3 divisions  IX, X: palate rises symmetrically XI: Head turn and shoulder shrug normal bilaterally  XII: tongue midline  Motor: Strength 5/5 on all upper and lower extremities, bulk muscle and tone are normal  Gait:Normal   Sensory: Light touch intact and symmetric bilaterally  Coordination: There is no dysmetria on finger-to-nose. Rapid alternating movement test normal.  Psychiatric: Normal mood and affect     Assessment & Plan:   See Encounters Tab for problem based charting.  Patient discussed with Dr. 

## 2020-08-15 NOTE — Patient Instructions (Addendum)
Cristina Burke,  Thanks for seeing me today.  Overall, I am not entirely sure what was causing these episodes of anxiousness and starving.  As we discussed, I would like you to stop all that workout supplements.  I will give you a prescription for glucose kit.  Please check your glucose first thing when you wake up in the morning and during these episodes where you feel shaky and starving.  If your blood glucose drops below 55, please write this down and give [place] a call.  Take Care! Dr. Eileen Stanford  Please call the internal medicine center clinic if you have any questions or concerns, we may be able to help and keep you from a long and expensive emergency room wait. Our clinic and after hours phone number is [phone], the best time to call is Monday through Friday 9 am to 4 pm but there is always someone available 24/7 if you have an emergency. If you need medication refills please notify your pharmacy one week in advance and they will send [place] a request.   If you have not gotten the COVID vaccine, I recommend doing so:  You may get it at your local [facility] or [facility] OR To schedule an appointment for a COVID vaccine or be added to the vaccine wait list: Go to [email or web address]   OR Go to [email or web address]                  OR Call [phone]                                     OR Call [phone] and select Option 2

## 2020-08-16 LAB — IRON AND TIBC
Iron Saturation: 12 % — ABNORMAL LOW (ref 15–55)
Iron: 58 ug/dL (ref 27–159)
Total Iron Binding Capacity: 483 ug/dL — ABNORMAL HIGH (ref 250–450)
UIBC: 425 ug/dL (ref 131–425)

## 2020-08-16 LAB — CBC
Hematocrit: 36.9 % (ref 34.0–46.6)
Hemoglobin: 12 g/dL (ref 11.1–15.9)
MCH: 26.9 pg (ref 26.6–33.0)
MCHC: 32.5 g/dL (ref 31.5–35.7)
MCV: 83 fL (ref 79–97)
Platelets: 269 10*3/uL (ref 150–450)
RBC: 4.46 x10E6/uL (ref 3.77–5.28)
RDW: 14.2 % (ref 11.7–15.4)
WBC: 5.8 10*3/uL (ref 3.4–10.8)

## 2020-08-16 LAB — FERRITIN: Ferritin: 8 ng/mL — ABNORMAL LOW (ref 15–150)

## 2020-08-16 MED ORDER — POLYSACCHARIDE IRON COMPLEX 150 MG PO CAPS: 150.0000 mg | ORAL_CAPSULE | Freq: Every day | ORAL | 3 refills | Status: DC

## 2020-08-16 NOTE — Addendum Note (Signed)
Addended by: Yvette Rack on: 08/16/2020 08:21 AM   Modules accepted: Orders

## 2020-08-22 NOTE — Progress Notes (Signed)
Internal Medicine Clinic Attending  Case discussed with Dr. Agyei  At the time of the visit.  We reviewed the resident's history and exam and pertinent patient test results.  I agree with the assessment, diagnosis, and plan of care documented in the resident's note.  

## 2020-09-05 ENCOUNTER — Other Ambulatory Visit: Payer: Self-pay

## 2020-09-05 ENCOUNTER — Ambulatory Visit
Admission: EM | Admit: 2020-09-05 | Discharge: 2020-09-05 | Disposition: A | Discharge status: Home / Self Care | Payer: BC Managed Care – PPO | Attending: Emergency Medicine | Admitting: Emergency Medicine

## 2020-09-05 DIAGNOSIS — Z20822 Contact with and (suspected) exposure to covid-19: Secondary | ICD-10-CM

## 2020-09-05 DIAGNOSIS — J069 Acute upper respiratory infection, unspecified: Secondary | ICD-10-CM

## 2020-09-05 NOTE — ED Triage Notes (Signed)
Pt c/o dry cough, runny nose, chest congestion, and sore throat since yesterday.

## 2020-09-05 NOTE — ED Provider Notes (Signed)
[name]    CSN: [id number] Arrival date & time: 09/05/20  8182      History   Chief Complaint Chief Complaint  Patient presents with  . Cough    HPI Cristina Burke is a 30 y.o. female.   The history is provided by the patient. No language interpreter was used.  Cough Cough characteristics:  Non-productive Sputum characteristics:  Nondescript Severity:  Moderate Onset quality:  Gradual Timing:  Constant Progression:  Worsening Chronicity:  New Relieved by:  Nothing Worsened by:  Nothing Ineffective treatments:  None tried Associated symptoms: sore throat     Past Medical History:  Diagnosis Date  . Medical history non-contributory   . No pertinent past medical history     Patient Active Problem List   Diagnosis Date Noted  . Shakiness 08/15/2020    Past Surgical History:  Procedure Laterality Date  . NO PAST SURGERIES    . TUBAL LIGATION Bilateral 07/03/2016   Procedure: POST PARTUM TUBAL LIGATION;  Surgeon: Woodroe Mode, MD;  Location: [facility];  Service: Gynecology;  Laterality: Bilateral;    OB History    Gravida  3   Para  3   Term  3   Preterm      AB      Living  3     SAB      IAB      Ectopic      Multiple  0   Live Births  3            Home Medications    Prior to Admission medications   Medication Sig Start Date End Date Taking? Authorizing Provider  blood glucose meter kit and supplies KIT Dispense based on patient and insurance preference. Use up to four times daily as directed. (FOR ICD-9 250.00, 250.01). 08/15/20   Jean Rosenthal, MD  iron polysaccharides (NU-IRON) 150 MG capsule Take 1 capsule (150 mg total) by mouth daily. 08/16/20   Jean Rosenthal, MD  FLUoxetine (PROZAC) 10 MG tablet Take 1 tablet (10 mg total) by mouth daily. 11/29/18 10/18/19  Robyn Haber, MD  fluticasone (FLONASE) 50 MCG/ACT nasal spray Place 2 sprays into both nostrils daily. 06/03/19 10/18/19  Hall-Potvin, [place], PA-C   ipratropium (ATROVENT) 0.06 % nasal spray Place 2 sprays into both nostrils 4 (four) times daily. 01/04/19 10/18/19  Ok Edwards, PA-C    Family History Family History  Problem Relation Age of Onset  . Hypertension Mother   . Diabetes Mother   . Diabetes Brother   . Diabetes Sister     Social History Social History   Tobacco Use  . Smoking status: Never Smoker  . Smokeless tobacco: Never Used  Substance Use Topics  . Alcohol use: No  . Drug use: No     Allergies   Patient has no known allergies.   Review of Systems Review of Systems  HENT: Positive for sore throat.   Respiratory: Positive for cough.   All other systems reviewed and are negative.    Physical Exam Triage Vital Signs ED Triage Vitals [09/05/20 1009]  Enc Vitals Group     BP (!) 133/99     Pulse Rate 97     Resp 18     Temp 98.6 F (37 C)     Temp Source Oral     SpO2 98 %     Weight      Height      Head Circumference  Peak Flow      Pain Score 5     Pain Loc      Pain Edu?      Excl. in [place]?    No data found.  Updated Vital Signs BP (!) 133/99 (BP Location: Left Arm)   Pulse 97   Temp 98.6 F (37 C) (Oral)   Resp 18   LMP 09/04/2020   SpO2 98%   Breastfeeding No   Visual Acuity Right Eye Distance:   Left Eye Distance:   Bilateral Distance:    Right Eye Near:   Left Eye Near:    Bilateral Near:     Physical Exam Vitals and nursing note reviewed.  Constitutional:      Appearance: She is well-developed and well-nourished.  HENT:     Head: Normocephalic.     Nose: Nose normal.     Mouth/Throat:     Pharynx: Posterior oropharyngeal erythema present.  Eyes:     Extraocular Movements: EOM normal.  Cardiovascular:     Rate and Rhythm: Normal rate.  Pulmonary:     Effort: Pulmonary effort is normal.  Abdominal:     General: There is no distension.  Musculoskeletal:        General: Normal range of motion.     Cervical back: Normal range of motion.  Skin:     General: Skin is warm.  Neurological:     Mental Status: She is alert and oriented to person, place, and time.  Psychiatric:        Mood and Affect: Mood and affect and mood normal.      UC Treatments / Results  Labs (all labs ordered are listed, but only abnormal results are displayed) Labs Reviewed  NOVEL CORONAVIRUS, NAA    EKG   Radiology No results found.  Procedures Procedures (including critical care time)  Medications Ordered in UC Medications - No data to display  Initial Impression / Assessment and Plan / UC Course  I have reviewed the triage vital signs and the nursing notes.  Pertinent labs & imaging results that were available during my care of the patient were reviewed by me and considered in my medical decision making (see chart for details).     MDM:  Covid pending Final Clinical Impressions(s) / UC Diagnoses   Final diagnoses:  Encounter for screening laboratory testing for COVID-19 virus  Viral URI     Discharge Instructions     Your covid test if pending   ED Prescriptions    None     PDMP not reviewed this encounter.  An After Visit Summary was printed and given to the patient.    Fransico Meadow, [place] 09/05/20 1049

## 2020-09-05 NOTE — Discharge Instructions (Signed)
Your covid test if pending

## 2020-09-07 LAB — SARS-COV-2, NAA 2 DAY TAT

## 2020-09-07 LAB — NOVEL CORONAVIRUS, NAA: SARS-CoV-2, NAA: DETECTED — AB

## 2020-10-16 ENCOUNTER — Other Ambulatory Visit: Payer: Self-pay

## 2020-10-16 ENCOUNTER — Ambulatory Visit: Payer: BC Managed Care – PPO | Admitting: Internal Medicine

## 2020-10-16 ENCOUNTER — Encounter: Payer: Self-pay | Admitting: Internal Medicine

## 2020-10-16 VITALS — BP 125/70 | HR 81 | Temp 98.3°F | Ht 61.0 in | Wt 170.6 lb

## 2020-10-16 DIAGNOSIS — R5383 Other fatigue: Secondary | ICD-10-CM | POA: Diagnosis not present

## 2020-10-16 DIAGNOSIS — G44219 Episodic tension-type headache, not intractable: Secondary | ICD-10-CM

## 2020-10-16 DIAGNOSIS — R590 Localized enlarged lymph nodes: Secondary | ICD-10-CM

## 2020-10-16 DIAGNOSIS — E611 Iron deficiency: Secondary | ICD-10-CM | POA: Diagnosis not present

## 2020-10-16 DIAGNOSIS — D509 Iron deficiency anemia, unspecified: Secondary | ICD-10-CM | POA: Insufficient documentation

## 2020-10-16 DIAGNOSIS — R251 Tremor, unspecified: Secondary | ICD-10-CM

## 2020-10-16 DIAGNOSIS — R519 Headache, unspecified: Secondary | ICD-10-CM | POA: Insufficient documentation

## 2020-10-16 NOTE — Assessment & Plan Note (Deleted)
#  Iron deficiency anemia: Continues to deny menorrhagia, hematochezia, bright red blood per rectum, hematuria, hemoptysis, hematemesis.  She endorses compliance to oral iron supplement  Plan:  -Continue oral iron supplements -If no improvement, will pursue work-up for possible celiac's disease 

## 2020-10-16 NOTE — Assessment & Plan Note (Signed)
#  Headache: Cristina Burke has a medical history significant for iron deficiency anemia whom I had the pleasure of seeing as a new patient in December 2021 where she presented with shakiness.  She had reported of feeling a sensation of shakiness and intense hunger at times of the day that was nonspecific.  At the end of our visit, it was concluded that she did have iron deficiency and was started on oral iron supplements.  Today, she states that she has a prior history of migraine headache which is intermittent and typically happens about 3-5 times per month.  In the past, she used to take ibuprofen but has not taken any NSAIDs in months because this has not helped with her headaches.  Yesterday, she began experiencing occipital headache which she describes as a pressure-like sensation, postauricular and isolated retro-orbital which lasted throughout the entire day.  She endorses worsening headache with photophobia however denies any changes with sound.  She did endorse nausea, pounding sensation and dizziness but did not report of weakness or vomiting.  She denied rhinorrhea, blurry vision, photopsia or double vision.  During onset, she took a migraine pill and went to bed.  She states that she has been under a lot of stress recently due to financial burden and she is currently waiting for her tax refund to settle her finances.  Due to this, she has had low energy and spends all day in bed.  She also reports that sometimes she will feel a sensation of her legs trying to give out but denies any urge to move her legs.  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  Less likely idiopathic intracranial hypertension.  Plan:  -Advised to take NSAIDs as needed and let us know if she experiences frequent headaches, particularly more than 15 days/month -Will hold off referral to ophthalmology as illness prescription points away from idiopathic intracranial hypertension

## 2020-10-16 NOTE — Assessment & Plan Note (Signed)
#  Postinflammatory lymphadenopathy: She states that she has had swelling of one of her cervical lymph node for years which typically worsens each time she gets an infection.  She was diagnosed with Covid in January and during that time, her lymph node swelled up and was tender to palpation.  She denies fevers, night sweats or weight loss  On examination today, there was mild swelling of her right lower anterior cervical lymph node.

## 2020-10-16 NOTE — Assessment & Plan Note (Signed)
#  Low energy: She states that recently, she has been under a lot of stress due to financial burden.  Due to this, she has had low energy, stays in bed all day and lacks the desire to get up.  She states that she is waiting for her tax returns to settle out her finances.  Her PHQ-9 is 19  Plan: -Follow-up TSH, iron, ferritin, TIBC -If unremarkable, I believe it would be reasonable to start an SSRI

## 2020-10-16 NOTE — Progress Notes (Signed)
   CC: Headache, Dizziness  HPI:  Cristina Burke is a 30 y.o. With medical history significant for iron deficiency anemia and prior COVID-19 infection (tested positive for COVID-19 in January) here for evaluation of headache and dizziness.  Please see problem based charting for further details.  Past Medical History:  Diagnosis Date  . Medical history non-contributory   . No pertinent past medical history    Review of Systems:  As per HPI  Physical Exam:  Vitals:   10/16/20 1533  BP: 125/70  Pulse: 81  Temp: 98.3 F (36.8 C)  TempSrc: Oral  SpO2: 99%  Weight: 170 lb 9.6 oz (77.4 kg)  Height: 5\' 1"  (1.549 m)   Physical Exam Vitals and nursing note reviewed.  Constitutional:      Appearance: She is obese.  Eyes:     General: Lids are normal. Lids are everted, no foreign bodies appreciated.     Pupils: Pupils are equal, round, and reactive to light.     Funduscopic exam:    Right eye: No papilledema.        Left eye: No papilledema.     Comments: No evidence of papilledema  Cardiovascular:     Rate and Rhythm: Normal rate.     Heart sounds: Normal heart sounds.  Pulmonary:     Breath sounds: Normal breath sounds.  Musculoskeletal:        General: Normal range of motion.  Neurological:     Comments: Neurologic exam: Mental status: Alert Cranial Nerves: V, VII: sensation intact in all 3 divisions  Motor: Strength 5/5 on all upper and lower extremities, bulk muscle and tone are normal  Gait:Normal Sensory: Light touch intact and symmetric bilaterally  Coordination: There is no dysmetria on finger-to-nose. Rapid alternating movement test normal.  Psychiatric: Normal mood and affect     Assessment & Plan:   See Encounters Tab for problem based charting.  Patient discussed with Dr. 

## 2020-10-16 NOTE — Assessment & Plan Note (Signed)
#  Iron deficiency anemia: Continues to deny menorrhagia, hematochezia, bright red blood per rectum, hematuria, hemoptysis, hematemesis.  She endorses compliance to oral iron supplement  Plan:  -Continue oral iron supplements -If no improvement, will pursue work-up for possible celiac's disease

## 2020-10-16 NOTE — Patient Instructions (Signed)
Cristina Burke,   It was a pleasure taking care of you here in the clinic.  I am going to order some labs today to check on your iron and thyroid.  If these labs come back normal, we will start you on a medication for your mood and feelings of depression.  Take care!

## 2020-10-17 ENCOUNTER — Other Ambulatory Visit: Payer: Self-pay | Admitting: Internal Medicine

## 2020-10-17 DIAGNOSIS — E611 Iron deficiency: Secondary | ICD-10-CM

## 2020-10-17 LAB — IRON AND TIBC
Iron Saturation: 7 % — CL (ref 15–55)
Iron: 31 ug/dL (ref 27–159)
Total Iron Binding Capacity: 452 ug/dL — ABNORMAL HIGH (ref 250–450)
UIBC: 421 ug/dL (ref 131–425)

## 2020-10-17 LAB — TSH: TSH: 0.774 u[IU]/mL (ref 0.450–4.500)

## 2020-10-17 LAB — FERRITIN: Ferritin: 8 ng/mL — ABNORMAL LOW (ref 15–150)

## 2020-10-17 NOTE — Progress Notes (Signed)
Internal Medicine Clinic Attending  Case discussed with Dr. Agyei  At the time of the visit.  We reviewed the resident's history and exam and pertinent patient test results.  I agree with the assessment, diagnosis, and plan of care documented in the resident's note.  

## 2020-10-23 ENCOUNTER — Telehealth: Payer: Self-pay | Admitting: *Deleted

## 2020-10-23 NOTE — Telephone Encounter (Signed)
-----   Message from Yvette Rack, MD sent at 10/17/2020  3:19 PM EST ----- IV Iron x 1 dose

## 2020-10-23 NOTE — Telephone Encounter (Signed)
Date: 10/31/2020 Status: Sch  Time: 8:00 AM Length: 60  Visit Type: FERAHEME [1897] Copay: $0.00  Provider: [id number]     Attempt to contact pt with above appt-no answer-CMA contact info left on recorder for call back.Marland Kitchen

## 2020-10-31 ENCOUNTER — Other Ambulatory Visit: Payer: Self-pay | Admitting: Student

## 2020-10-31 ENCOUNTER — Ambulatory Visit (HOSPITAL_COMMUNITY)
Admission: RE | Admit: 2020-10-31 | Discharge: 2020-10-31 | Disposition: A | Discharge status: Home / Self Care | Payer: BC Managed Care – PPO | Source: Ambulatory Visit | Attending: Internal Medicine | Admitting: Internal Medicine

## 2020-10-31 ENCOUNTER — Telehealth: Payer: Self-pay

## 2020-10-31 ENCOUNTER — Other Ambulatory Visit: Payer: Self-pay

## 2020-10-31 DIAGNOSIS — G43C0 Periodic headache syndromes in child or adult, not intractable: Secondary | ICD-10-CM

## 2020-10-31 DIAGNOSIS — G44219 Episodic tension-type headache, not intractable: Secondary | ICD-10-CM

## 2020-10-31 DIAGNOSIS — E611 Iron deficiency: Secondary | ICD-10-CM | POA: Diagnosis not present

## 2020-10-31 MED ORDER — SODIUM CHLORIDE 0.9 % IV SOLN
510.0000 mg | Freq: Once | INTRAVENOUS | Status: AC
  Administered 2020-10-31: 510 mg via INTRAVENOUS
  Filled 2020-10-31: qty 510

## 2020-10-31 NOTE — Discharge Instructions (Signed)
Ferumoxytol injection What is this medicine? FERUMOXYTOL is an iron complex. Iron is used to make healthy red blood cells, which carry oxygen and nutrients throughout the body. This medicine is used to treat iron deficiency anemia. This medicine may be used for other purposes; ask your health care provider or pharmacist if you have questions. COMMON BRAND NAME(S): Feraheme What should I tell my health care provider before I take this medicine? They need to know if you have any of these conditions:  anemia not caused by low iron levels  high levels of iron in the blood  magnetic resonance imaging (MRI) test scheduled  an unusual or allergic reaction to iron, other medicines, foods, dyes, or preservatives  pregnant or trying to get pregnant  breast-feeding How should I use this medicine? This medicine is for injection into a vein. It is given by a health care professional in a hospital or clinic setting. Talk to your pediatrician regarding the use of this medicine in children. Special care may be needed. Overdosage: If you think you have taken too much of this medicine contact a poison control center or emergency room at once. NOTE: This medicine is only for you. Do not share this medicine with others. What if I miss a dose? It is important not to miss your dose. Call your doctor or health care professional if you are unable to keep an appointment. What may interact with this medicine? This medicine may interact with the following medications:  other iron products This list may not describe all possible interactions. Give your health care provider a list of all the medicines, herbs, non-prescription drugs, or dietary supplements you use. Also tell them if you smoke, drink alcohol, or use illegal drugs. Some items may interact with your medicine. What should I watch for while using this medicine? Visit your doctor or healthcare professional regularly. Tell your doctor or healthcare  professional if your symptoms do not start to get better or if they get worse. You may need blood work done while you are taking this medicine. You may need to follow a special diet. Talk to your doctor. Foods that contain iron include: whole grains/cereals, dried fruits, beans, or peas, leafy green vegetables, and organ meats (liver, kidney). What side effects may I notice from receiving this medicine? Side effects that you should report to your doctor or health care professional as soon as possible:  allergic reactions like skin rash, itching or hives, swelling of the face, lips, or tongue  breathing problems  changes in blood pressure  feeling faint or lightheaded, falls  fever or chills  flushing, sweating, or hot feelings  swelling of the ankles or feet Side effects that usually do not require medical attention (report to your doctor or health care professional if they continue or are bothersome):  diarrhea  headache  nausea, vomiting  stomach pain This list may not describe all possible side effects. Call your doctor for medical advice about side effects. You may report side effects to FDA at 1-800-FDA-1088. Where should I keep my medicine? This drug is given in a hospital or clinic and will not be stored at home. NOTE: This sheet is a summary. It may not cover all possible information. If you have questions about this medicine, talk to your doctor, pharmacist, or health care provider.  2021 Elsevier/Gold Standard (2016-09-29 20:21:10)  

## 2020-10-31 NOTE — Telephone Encounter (Signed)
Received a TC from [employer], nurse with [facility].  She is calling to report "pt had 1st Feraheme infusion this morning.  After infusion was started Pt started to get SOB, head and chest pressure, pt was red in face.  Infusion was immediately stopped at this time.  Patient started feeling better after infusion was stopped and VS remained stable.  Nurse added Feraheme to patient's allergy list.  Pt was stable and discharged from infusion center". SChaplin, RN,BSN

## 2020-10-31 NOTE — Progress Notes (Signed)
We were notified for patient's hypersensitivity reaction to iron transfusion this morning.  Symptoms include shortness of breath, head and chest pressure, and flushing.  Her symptoms resolved with stopping the transfusion.  Patient reports not taking oral new iron due to constipation.  Advised patient to take MiraLAX to help with her bowel movements and continue the oral iron.  Will repeat labs when patient comes back for her next visit.  Patient reports concern of her headache.  She last saw Dr. Dortha Schwalbe in February and was diagnosed with mixed tension-migraine headache.  She reports having one episode every 2 weeks.  Reports pressure in the back of her head and also the sides, there was associated nausea, vomiting.  She takes NSAID with a migraine pill with minimal relief.  Patient expressed that she would like to get a head imaging to figure out what is going on.  I explained to patient that she currently has no red flags symptoms that warrant a head imaging such as CT or MRI.  I offered her to start a prophylactic medication for her migraine headache.  Patient verbalizes understand but assisted on getting head imaging.  Will order CT head without contrast to rule out any causes that can cause increased intracranial pressure.   Doran Stabler, DO

## 2020-10-31 NOTE — Progress Notes (Signed)
IV Feraheme started at 0825 and stopped at 0828 due to pt reporting shortness of breath, head and chest pressure. Feeling better by [phone]. Feraheme added to allergy/intolerance list. MD informed. VSS at this time. No acute distress noted.

## 2020-10-31 NOTE — Telephone Encounter (Signed)
I called and spoke to patient.  Please see my note for details.  Thank you

## 2020-10-31 NOTE — Progress Notes (Signed)
Spoke with Cristina Burke in triage with Dr. Verdell Face office. Okay for pt to DC home.

## 2020-12-16 ENCOUNTER — Other Ambulatory Visit: Payer: Self-pay

## 2020-12-16 ENCOUNTER — Emergency Department (HOSPITAL_COMMUNITY)
Admission: EM | Admit: 2020-12-16 | Discharge: 2020-12-17 | Disposition: A | Discharge status: Home / Self Care | Payer: BC Managed Care – PPO | Attending: Emergency Medicine | Admitting: Emergency Medicine

## 2020-12-16 ENCOUNTER — Encounter (HOSPITAL_COMMUNITY): Payer: Self-pay

## 2020-12-16 DIAGNOSIS — R42 Dizziness and giddiness: Secondary | ICD-10-CM | POA: Diagnosis not present

## 2020-12-16 DIAGNOSIS — R519 Headache, unspecified: Secondary | ICD-10-CM | POA: Diagnosis not present

## 2020-12-16 DIAGNOSIS — R110 Nausea: Secondary | ICD-10-CM | POA: Insufficient documentation

## 2020-12-16 NOTE — ED Triage Notes (Signed)
Head pressure and nausea gets worse upon movement. Denies any recent trauma. No slurring of speech.

## 2020-12-16 NOTE — ED Triage Notes (Signed)
Emergency Medicine Provider Triage Evaluation Note  Cristina Burke , a 30 y.o. female  was evaluated in triage.  Pt complains of intermittent head pressure for numerous months. Patient states pressure is located in the posterior aspect of her head. Pressure is worse when leaning forward or with different types of head movements. Has a history of headache, but notes this feels different than her normal headaches. She has been evaluated by PCP who she notes had plans to order a CT head. Today she felt acutely dizzy and nauseous with her head pressure. No syncope.  Review of Systems  Positive: headache   Physical Exam  BP 128/81 (BP Location: Right Arm)   Pulse 94   Temp 98 F (36.7 C) (Oral)   Resp 18   Ht 5\' 1"  (1.549 m)   Wt 76.2 kg   SpO2 99%   BMI 31.74 kg/m  Gen:   Awake, no distress   HEENT:  Atraumatic  Resp:  Normal effort  Cardiac:  Normal rate  Abd:   Nondistended, nontender  MSK:   Moves extremities without difficulty  Neuro:  Speech clear   Medical Decision Making  Medically screening exam initiated at 11:25 PM.  Appropriate orders placed.  Cristina Burke was informed that the remainder of the evaluation will be completed by another provider, this initial triage assessment does not replace that evaluation, and the importance of remaining in the ED until their evaluation is complete.  Clinical Impression  Headache. CT head.    Doreen Beam, Mannie Stabile 12/16/20 2328

## 2020-12-17 ENCOUNTER — Emergency Department (HOSPITAL_COMMUNITY): Payer: BC Managed Care – PPO

## 2020-12-17 LAB — I-STAT BETA HCG BLOOD, ED (MC, WL, AP ONLY): I-stat hCG, quantitative: <5 m[IU]/mL (ref <5)

## 2020-12-17 NOTE — ED Notes (Signed)
E-signature pad unavailable at time of pt discharge. This RN discussed discharge materials with pt and answered all pt questions. Pt stated understanding of discharge material. ? ?

## 2020-12-17 NOTE — ED Provider Notes (Signed)
[facility] EMERGENCY DEPARTMENT Provider Note   CSN: [id number] Arrival date & time: 12/16/20  2251     History Chief Complaint  Patient presents with  . Headache  . Nausea    Cristina Burke is a 30 y.o. female.  Patient presents to the emergency department with a chief complaint of headache.  She reports that she has been having intermittent headaches for several months.  She states that she feels a pressure on the left side of her head that is sometimes brought on when she exercises or when she stands up.  She states that occasionally she feels dizzy.  She reports history of migraines, and states that her doctor has attributed the symptoms to migraines.  She has never been seen by neurology.  She denies any fevers or chills.  No reported numbness, weakness, tingling.  The history is provided by the patient. No language interpreter was used.       Past Medical History:  Diagnosis Date  . Medical history non-contributory   . No pertinent past medical history     Patient Active Problem List   Diagnosis Date Noted  . Headache 10/16/2020  . Low energy 10/16/2020  . Cervical lymphadenopathy 10/16/2020  . Iron deficiency without anemia 10/16/2020  . Shakiness 08/15/2020    Past Surgical History:  Procedure Laterality Date  . NO PAST SURGERIES    . TUBAL LIGATION Bilateral 07/03/2016   Procedure: POST PARTUM TUBAL LIGATION;  Surgeon: Woodroe Mode, MD;  Location: [facility];  Service: Gynecology;  Laterality: Bilateral;     OB History    Gravida  3   Para  3   Term  3   Preterm      AB      Living  3     SAB      IAB      Ectopic      Multiple  0   Live Births  3           Family History  Problem Relation Age of Onset  . Hypertension Mother   . Diabetes Mother   . Diabetes Brother   . Diabetes Sister     Social History   Tobacco Use  . Smoking status: Never Smoker  . Smokeless tobacco: Never Used  Substance Use Topics   . Alcohol use: No  . Drug use: No    Home Medications Prior to Admission medications   Medication Sig Start Date End Date Taking? Authorizing Provider  blood glucose meter kit and supplies KIT Dispense based on patient and insurance preference. Use up to four times daily as directed. (FOR ICD-9 250.00, 250.01). 08/15/20   Jean Rosenthal, MD  iron polysaccharides (NU-IRON) 150 MG capsule Take 1 capsule (150 mg total) by mouth daily. 08/16/20   Jean Rosenthal, MD  FLUoxetine (PROZAC) 10 MG tablet Take 1 tablet (10 mg total) by mouth daily. 11/29/18 10/18/19  Robyn Haber, MD  fluticasone (FLONASE) 50 MCG/ACT nasal spray Place 2 sprays into both nostrils daily. 06/03/19 10/18/19  Hall-Potvin, [place], PA-C  ipratropium (ATROVENT) 0.06 % nasal spray Place 2 sprays into both nostrils 4 (four) times daily. 01/04/19 10/18/19  Ok Edwards, PA-C    Allergies    Feraheme [ferumoxytol]  Review of Systems   Review of Systems  All other systems reviewed and are negative.   Physical Exam Updated Vital Signs BP 121/79 (BP Location: Right Arm)   Pulse 78   Temp  27 F (36.7 C) (Oral)   Resp 15   Ht '5\' 1"'  (1.549 m)   Wt 76.2 kg   SpO2 98%   BMI 31.74 kg/m   Physical Exam Vitals and nursing note reviewed.  Constitutional:      General: She is not in acute distress.    Appearance: She is well-developed.  HENT:     Head: Normocephalic and atraumatic.  Eyes:     Extraocular Movements: Extraocular movements intact.     Conjunctiva/sclera: Conjunctivae normal.  Cardiovascular:     Rate and Rhythm: Normal rate and regular rhythm.     Heart sounds: No murmur heard.   Pulmonary:     Effort: Pulmonary effort is normal. No respiratory distress.     Breath sounds: Normal breath sounds.  Abdominal:     Palpations: Abdomen is soft.     Tenderness: There is no abdominal tenderness.  Musculoskeletal:        General: Normal range of motion.     Cervical back: Neck supple.  Skin:    General: Skin  is warm and dry.  Neurological:     Mental Status: She is alert and oriented to person, place, and time.     Comments: CN 3-12 intact Normal speech Normal gait No pronator drift Normal finger to nose  Psychiatric:        Mood and Affect: Mood normal.        Behavior: Behavior normal.     ED Results / Procedures / Treatments   Labs (all labs ordered are listed, but only abnormal results are displayed) Labs Reviewed  I-STAT BETA HCG BLOOD, ED ([facility], [facility], [facility] ONLY)    EKG None  Radiology CT Head Wo Contrast  Result Date: 12/17/2020 CLINICAL DATA:  Dizziness. Posterior head pressure for 3 months. No injury. EXAM: CT HEAD WITHOUT CONTRAST TECHNIQUE: Contiguous axial images were obtained from the base of the skull through the vertex without intravenous contrast. COMPARISON:  None. FINDINGS: Brain: No evidence of acute infarction, hemorrhage, hydrocephalus, extra-axial collection or mass lesion/mass effect. Vascular: No hyperdense vessel or unexpected calcification. Skull: Normal. Negative for fracture or focal lesion. Sinuses/Orbits: No acute finding. Other: None. IMPRESSION: No acute intracranial abnormalities. Electronically Signed   By: Lucienne Capers M.D.   On: 12/17/2020 01:06    Procedures Procedures   Medications Ordered in ED Medications - No data to display  ED Course  I have reviewed the triage vital signs and the nursing notes.  Pertinent labs & imaging results that were available during my care of the patient were reviewed by me and considered in my medical decision making (see chart for details).    MDM Rules/Calculators/A&P                          Patient here with intermittent headaches times several months.  She states that today she felt dizzy and nauseous.  The symptoms have resolved.  CT head ordered in triage is negative for obvious abnormality.  I discussed treatment options versus referral.  Patient would like referral to neurology and discharge from  ED.  I do not feel that she warrants any further emergent work-up tonight as this problem has been ongoing for many months and she has a reassuring head CT.  She is agreeable with this plan. Final Clinical Impression(s) / ED Diagnoses Final diagnoses:  Frequent headaches    Rx / DC Orders ED Discharge Orders    None  Montine Circle, PA-C 94/09/82 [id number]    Delora Fuel, MD 19/82/42 [phone]

## 2020-12-17 NOTE — Discharge Instructions (Addendum)
Your CT scan findings are below.  Nothing concerning was seen.  Please follow-up with the neurologist.  CT Head: Brain: No evidence of acute infarction, hemorrhage, hydrocephalus, extra-axial collection or mass lesion/mass effect. Vascular: No hyperdense vessel or unexpected calcification. Skull: Normal. Negative for fracture or focal lesion. Sinuses/Orbits: No acute finding. Other: None. IMPRESSION: No acute intracranial abnormalities.

## 2021-02-26 ENCOUNTER — Encounter: Payer: Self-pay | Admitting: *Deleted

## 2021-02-28 ENCOUNTER — Ambulatory Visit: Payer: BC Managed Care – PPO | Admitting: Neurology

## 2021-05-16 ENCOUNTER — Other Ambulatory Visit: Payer: Self-pay

## 2021-05-16 ENCOUNTER — Ambulatory Visit: Payer: BC Managed Care – PPO | Admitting: Neurology

## 2021-05-16 ENCOUNTER — Encounter: Payer: Self-pay | Admitting: Neurology

## 2021-05-16 VITALS — BP 96/65 | HR 66 | Ht 61.0 in | Wt 158.8 lb

## 2021-05-16 DIAGNOSIS — G43009 Migraine without aura, not intractable, without status migrainosus: Secondary | ICD-10-CM | POA: Diagnosis not present

## 2021-05-16 DIAGNOSIS — R519 Headache, unspecified: Secondary | ICD-10-CM

## 2021-05-16 MED ORDER — RIZATRIPTAN BENZOATE 5 MG PO TBDP: 5.0000 mg | ORAL_TABLET | ORAL | 3 refills | Status: DC | PRN

## 2021-05-16 NOTE — Progress Notes (Signed)
Subjective:    Patient ID: Cristina Burke is a 30 y.o. female.  HPI    Star Age, MD, PhD [facility] [address] [place], [place] [address]  I saw patient, Cristina Burke, as a referral from the Emergency room for recurrent headaches.  The patient is unaccompanied today.  Cristina Burke is a 30 year old right-handed woman with an underlying benign medical history, who reports recurrent headaches for the past several years.  She has not been on any prescription medication for her headaches.  She had talked to her primary care provider about her headaches and was advised to use over-the-counter medications.  She uses Excedrin and has tried nausea medicine but does not recall the name of it.  She typically has a left-sided headache, it is throbbing and associated with nausea but no vomiting, often light sensitivity.  She has not had a formal eye examination since high school.  She does not have any prescription eyeglasses, no visual symptoms typically including no blurry vision or double vision or loss of vision, no visual aura.  She does have light sensitivity.  She estimates that she gets about 2-3 headaches in a week.  She has had worsening in the past year.  She has not had any sudden onset of one-sided weakness or numbness or tingling or droopy face or slurring of speech.  She has a family history of migraines affecting her mom.  She sleeps fairly well but has woken up up to 3 times a week with a headache which is typically in the right frontal and periorbital area.  She does not typically take anything first thing in the morning to help her headache.  She has occasional nocturia, not nightly.  She is not sure if she snores.  She lives with her boyfriend and her 3 children but she does not sleep in the same bedroom with her boyfriend.  She is a non-smoker and does not drink any alcohol.  She drinks caffeine in limitation, usually 1 soda per day.  She tries  to hydrate well with water.  She works as a Estate agent.  She presented to the emergency room at [facility] on 12/16/2020 with a headache.  She also reported nausea and feeling dizzy.  She reported a several month history of recurrent headaches.  I reviewed the emergency room records.  She had a head CT without contrast on 12/17/2020 and I reviewed the results: IMPRESSION: No acute intracranial abnormalities. She did not receive any treatment in the emergency room.  She has not seen her primary care provider since then.  I reviewed a phone note from Dr. Alfonse Spruce from 10/31/2020.  She was offered preventative migraine medication but declined at the time.  She requested a CT scan to be done.  She had a head CT without contrast on 12/17/2020 and I reviewed the results:   IMPRESSION: No acute intracranial abnormalities.  Her Epworth sleepiness score is 9 out of 24, fatigue severity score is 38 out of 63.   When she saw Dr. Eileen Stanford in February 2022 she was advised to use NSAIDs as needed, ophthalmology referral was discussed at the time.  Her Past Medical History Is Significant For: Past Medical History:  Diagnosis Date   Headache    Medical history non-contributory    No pertinent past medical history     Her Past Surgical History Is Significant For: Past Surgical History:  Procedure Laterality Date   NO PAST SURGERIES  TUBAL LIGATION Bilateral 07/03/2016   Procedure: POST PARTUM TUBAL LIGATION;  Surgeon: Woodroe Mode, MD;  Location: [facility];  Service: Gynecology;  Laterality: Bilateral;    Her Family History Is Significant For: Family History  Problem Relation Age of Onset   Migraines Mother    Hypertension Mother    Diabetes Mother    Diabetes Sister    Diabetes Brother     Her Social History Is Significant For: Social History   Socioeconomic History   Marital status: Single    Spouse name: Not on file   Number of children: Not on file   Years of education: Not on file    Highest education level: Not on file  Occupational History   Not on file  Tobacco Use   Smoking status: Never   Smokeless tobacco: Never  Vaping Use   Vaping Use: Never used  Substance and Sexual Activity   Alcohol use: No   Drug use: No   Sexual activity: Yes    Birth control/protection: None  Other Topics Concern   Not on file  Social History Narrative   Caffeine coke 1 day,  education:  College [employer]  Work: [employer].  Boyfriend/  3 kids.    Social Determinants of Health   Financial Resource Strain: Not on file  Food Insecurity: Not on file  Transportation Needs: Not on file  Physical Activity: Not on file  Stress: Not on file  Social Connections: Not on file    Her Allergies Are:  Allergies  Allergen Reactions   Feraheme [Ferumoxytol] Shortness Of Breath    Head and chest pressure  :   Her Current Medications Are:  Outpatient Encounter Medications as of 05/16/2021  Medication Sig   iron polysaccharides (NU-IRON) 150 MG capsule Take 1 capsule (150 mg total) by mouth daily. (Patient not taking: Reported on 05/16/2021)   [DISCONTINUED] blood glucose meter kit and supplies KIT Dispense based on patient and insurance preference. Use up to four times daily as directed. (FOR ICD-9 250.00, 250.01).   [DISCONTINUED] FLUoxetine (PROZAC) 10 MG tablet Take 1 tablet (10 mg total) by mouth daily.   [DISCONTINUED] fluticasone (FLONASE) 50 MCG/ACT nasal spray Place 2 sprays into both nostrils daily.   [DISCONTINUED] ipratropium (ATROVENT) 0.06 % nasal spray Place 2 sprays into both nostrils 4 (four) times daily.   No facility-administered encounter medications on file as of 05/16/2021.  : Review of Systems:  Out of a complete 14 point review of systems, all are reviewed and negative with the exception of these symptoms as listed below:   Review of Systems  Neurological:        Having migraines. Last 2 noted Pressure L side/ back of head.  Has used excedrin migraine  previously.  Noted 8//30day migrainous.     Objective:  Neurological Exam  Physical Exam Physical Examination:   Vitals:   05/16/21 1006  BP: 96/65  Pulse: 66    General Examination: The patient is a very pleasant 30 y.o. female in no acute distress. She appears well-developed and well-nourished and well groomed.   HEENT: Normocephalic, atraumatic, pupils are equal, round and reactive to light, extraocular tracking is well-preserved, she has mild sensitivity to light in the right eye.  Funduscopic exam is benign.  Facial symmetry normal, facial sensation normal to temperature and vibration.  Hearing grossly intact to tuning fork.  There is no lip, neck/head, jaw or voice tremor. Neck is supple with full range of passive  and active motion. There are no carotid bruits on auscultation. Oropharynx exam reveals: mild mouth dryness, good dental hygiene and moderate airway crowding, due to tonsillar size of about 2+, slightly wider uvula which is bicuspid.  Tongue protrudes centrally and palate elevates symmetrically.  Chest: Clear to auscultation without wheezing, rhonchi or crackles noted.  Heart: S1+S2+0, regular and normal without murmurs, rubs or gallops noted.   Abdomen: Soft, non-tender and non-distended with normal bowel sounds appreciated on auscultation.  Extremities: There is no pitting edema in the distal lower extremities bilaterally.   Skin: Warm and dry without trophic changes noted.   Musculoskeletal: exam reveals no obvious joint deformities, tenderness or joint swelling or erythema.   Neurologically:  Mental status: The patient is awake, alert and oriented in all 4 spheres. Her immediate and remote memory, attention, language skills and fund of knowledge are appropriate. There is no evidence of aphasia, agnosia, apraxia or anomia. Speech is clear with normal prosody and enunciation. Thought process is linear. Mood is normal and affect is normal.  Cranial nerves II - XII are  as described above under HEENT exam.  Motor exam: Normal bulk, strength and tone is noted. There is no tremor,, drift or rebound.  Romberg is negative.  Reflexes are 2+ throughout, toes are downgoing bilaterally.  Fine motor skills preserved with finger taps and hand movements and rapid alternating patting.  Cerebellar testing shows no dysmetria or intention tremor, normal finger-to-nose and normal heel-to-shin noted.  Sensory exam is intact to light touch, temperature and vibration in the upper and lower extremities bilaterally.  Gait, station and balance: She stands without difficulty, posture is normal, she walks without problems, normal tandem walk.  Assessment and Plan:   In summary, Cristina Burke is a very pleasant 30 y.o.-year old female with an underlying benign medical history, who presents as a referral from the emergency room for a several year history of recurrent headaches.  She has a benign exam, history is suggestive of a migrainous headache primarily.  She has typically left-sided headaches associated with photophobia and nausea.  She has not been on any preventative medication or prescription abortive medication for migraines.  She is advised to start a trial of Maxalt 5 mg strength orally disintegrating tablet.  I talked to her about this medication, and how to take it.  She has been sent a prescription to her pharmacy.  She had a recent head CT through the emergency room which was benign.  She has not had any updated eye examination in years.  She is advised to seek a formal dilated eye exam through an optometrist or ophthalmologist of her choosing.  She has woken up with a headache.  She may be at risk of obstructive sleep apnea.  I talked to her about this diagnosis and testing for this as well as treatment with positive airway pressure.  She is advised to proceed with a sleep study to rule out obstructive sleep apnea and consider treatment for sleep apnea as it may help her migraine  headaches but also her morning headaches.  She is agreeable to this approach.  She is concerned about her left-sided headache and pressure.  She is largely reassured today but we mutually agreed to go ahead and proceed with a brain MRI with and without contrast.  She is advised about the difference between a CT scan and a brain MRI.  She is agreeable to pursuing a brain MRI at this time, we will do a chemistry panel  in preparation of her brain MRI to make sure her kidney function is okay.  She is advised to follow-up routinely in 3 to 4 months, we will keep her posted as to her test results by phone call in the interim and consider treatment for sleep apnea if she qualifies.  We may also consider a headache preventative down the road but for now we will proceed with testing and a prescription for acute migraine management.  I answered all her questions today and she was in agreement with the plan.  This was an extended visit over 1 hour with significant record review required.    Star Age, MD, PhD

## 2021-05-16 NOTE — Patient Instructions (Addendum)
  Here is what we discussed today and what we came up with as our plan for you:   You likely have migraines, but may also have headaches due to underlying sleep apnea as discussed.  It would also be good for you to have an updated eye exam as it has been several years, straining while trying to see an inadvertently cause recurrent headaches.  Please schedule an appointment with any optometrist or ophthalmologist of your choosing for an updated, full eye exam.  For an acute migraine, I recommend you start as needed Maxalt orally disintegrating tab, 5 mg: take 1 pill early on when you suspect a migraine attack come on. You may take another pill within 2 hours, no more than 2 pills in 24 hours. Most people who take triptans do not have any serious side-effects. However, they can cause drowsiness (remember to not drive or use heavy machinery when drowsy), nausea, dizziness, dry mouth. Less common side effects include strange sensations, such as tightness in your chest or throat, tingling, flushing, and feelings of heaviness or pressure in areas such as the face, limbs, and chest. These in the chest can mimic heart related pain (angina) and may cause alarm, but usually these sensations are not harmful or a sign of a heart attack. However, if you develop intense chest pain or sensations of discomfort, you should stop taking your medication and consult with me or your PCP or go to the nearest urgent care facility or ER or call 911.   Based on your symptoms and your exam I believe you are at risk for obstructive sleep apnea (aka OSA), and I think we should proceed with a sleep study to determine whether you do or do not have OSA and how severe it is. Even, if you have mild OSA, I may want you to consider treatment with CPAP, as treatment of even borderline or mild sleep apnea can result and improvement of symptoms such as sleep disruption, daytime sleepiness, nighttime bathroom breaks, restless leg symptoms,  improvement of headache syndromes, even improved mood disorder.   Please remember, the long-term risks and ramifications of untreated moderate to severe obstructive sleep apnea are: increased Cardiovascular disease, including congestive heart failure, stroke, difficult to control hypertension, treatment resistant obesity, arrhythmias, especially irregular heartbeat commonly known as A. Fib. (atrial fibrillation); even type 2 diabetes has been linked to untreated OSA.   Your neurological exam is normal thankfully.  You also had a benign head CT recently.  Nevertheless, we can proceed with a more in-depth evaluation with a brain MRI with and without contrast.  We will do a blood test today to make sure your kidney function is okay prior to proceeding with a brain MRI.  We will call you once we have insurance authorization for your sleep study so to schedule your test.

## 2021-05-17 LAB — COMPREHENSIVE METABOLIC PANEL WITH GFR
ALT: 9 IU/L (ref 0–32)
AST: 12 IU/L (ref 0–40)
Albumin/Globulin Ratio: 1.6 (ref 1.2–2.2)
Albumin: 4.5 g/dL (ref 3.9–5.0)
Alkaline Phosphatase: 100 IU/L (ref 44–121)
BUN/Creatinine Ratio: 21 (ref 9–23)
BUN: 16 mg/dL (ref 6–20)
Bilirubin Total: 0.2 mg/dL (ref 0.0–1.2)
CO2: 21 mmol/L (ref 20–29)
Calcium: 9.2 mg/dL (ref 8.7–10.2)
Chloride: 106 mmol/L (ref 96–106)
Creatinine, Ser: 0.78 mg/dL (ref 0.57–1.00)
Globulin, Total: 2.9 g/dL (ref 1.5–4.5)
Glucose: 89 mg/dL (ref 65–99)
Potassium: 4.2 mmol/L (ref 3.5–5.2)
Sodium: 142 mmol/L (ref 134–144)
Total Protein: 7.4 g/dL (ref 6.0–8.5)
eGFR: 105 mL/min/1.73

## 2021-05-23 ENCOUNTER — Telehealth: Payer: Self-pay | Admitting: Neurology

## 2021-05-23 NOTE — Telephone Encounter (Signed)
LVM for pt to call back to schedule  [employer]ta Numbers: [id number] (exp. 05/22/21 to 06/20/21)

## 2021-05-28 ENCOUNTER — Telehealth: Payer: Self-pay

## 2021-05-28 NOTE — Telephone Encounter (Signed)
LVM for pt to call me back to schedule sleep study  

## 2021-06-08 ENCOUNTER — Ambulatory Visit
Admission: EM | Admit: 2021-06-08 | Discharge: 2021-06-08 | Disposition: A | Discharge status: Home / Self Care | Payer: BC Managed Care – PPO | Attending: Internal Medicine | Admitting: Internal Medicine

## 2021-06-08 DIAGNOSIS — J029 Acute pharyngitis, unspecified: Secondary | ICD-10-CM | POA: Insufficient documentation

## 2021-06-08 LAB — POCT RAPID STREP A (OFFICE): Rapid Strep A Screen: NEGATIVE

## 2021-06-08 MED ORDER — PREDNISONE 20 MG PO TABS: 40.0000 mg | ORAL_TABLET | Freq: Every day | ORAL | 0 refills | Status: AC

## 2021-06-08 NOTE — Discharge Instructions (Addendum)
Try CEPACOL lozenges. This might help with throat discomfort. Follow up with any further concerns.

## 2021-06-08 NOTE — ED Triage Notes (Signed)
Pt present sore throat with difficulty swallowing, symptoms started two weeks ago.

## 2021-06-08 NOTE — ED Provider Notes (Signed)
[name]    CSN: [id number] Arrival date & time: 06/08/21  1028      History   Chief Complaint Chief Complaint  Patient presents with   Sore Throat    HPI Cristina Burke is a 30 y.o. female.   Patient here today for evaluation of sore throat that she has had for the last 2 weeks.  She reports that she has had some mild congestion and minimal cough but overall her worse symptom is her sore throat.  She has not had fever or chills.  She denies any nausea or vomiting.  She denies any diarrhea.  She has tried multiple over-the-counter treatments without significant relief.  The history is provided by the patient.  Sore Throat Pertinent negatives include no abdominal pain and no shortness of breath.   Past Medical History:  Diagnosis Date   Headache    Medical history non-contributory    No pertinent past medical history     Patient Active Problem List   Diagnosis Date Noted   Headache 10/16/2020   Low energy 10/16/2020   Cervical lymphadenopathy 10/16/2020   Iron deficiency without anemia 10/16/2020   Shakiness 08/15/2020    Past Surgical History:  Procedure Laterality Date   NO PAST SURGERIES     TUBAL LIGATION Bilateral 07/03/2016   Procedure: POST PARTUM TUBAL LIGATION;  Surgeon: Adam Phenix, MD;  Location: [facility];  Service: Gynecology;  Laterality: Bilateral;    OB History     Gravida  3   Para  3   Term  3   Preterm      AB      Living  3      SAB      IAB      Ectopic      Multiple  0   Live Births  3            Home Medications    Prior to Admission medications   Medication Sig Start Date End Date Taking? Authorizing Provider  predniSONE (DELTASONE) 20 MG tablet Take 2 tablets (40 mg total) by mouth daily with breakfast for 5 days. 06/08/21 06/13/21 Yes Tomi Bamberger, PA-C  iron polysaccharides (NU-IRON) 150 MG capsule Take 1 capsule (150 mg total) by mouth daily. Patient not taking: Reported on 05/16/2021  08/16/20   Yvette Rack, MD  rizatriptan (MAXALT-MLT) 5 MG disintegrating tablet Take 1 tablet (5 mg total) by mouth as needed for migraine. May repeat in 2 h prn, no more than 2 pills/24 h, no more than 3 pills/week. 05/16/21   Huston Foley, MD  FLUoxetine (PROZAC) 10 MG tablet Take 1 tablet (10 mg total) by mouth daily. 11/29/18 10/18/19  Elvina Sidle, MD  fluticasone (FLONASE) 50 MCG/ACT nasal spray Place 2 sprays into both nostrils daily. 06/03/19 10/18/19  Hall-Potvin, [place], PA-C  ipratropium (ATROVENT) 0.06 % nasal spray Place 2 sprays into both nostrils 4 (four) times daily. 01/04/19 10/18/19  Belinda Fisher, PA-C    Family History Family History  Problem Relation Age of Onset   Migraines Mother    Hypertension Mother    Diabetes Mother    Diabetes Sister    Diabetes Brother     Social History Social History   Tobacco Use   Smoking status: Never   Smokeless tobacco: Never  Vaping Use   Vaping Use: Never used  Substance Use Topics   Alcohol use: No   Drug use: No  Allergies   Feraheme [ferumoxytol]   Review of Systems Review of Systems  Constitutional:  Negative for chills and fever.  HENT:  Positive for congestion, ear pain and sore throat. Negative for sinus pressure.   Eyes:  Negative for discharge and redness.  Respiratory:  Positive for cough. Negative for shortness of breath and wheezing.   Gastrointestinal:  Negative for abdominal pain, diarrhea, nausea and vomiting.    Physical Exam Triage Vital Signs ED Triage Vitals  Enc Vitals Group     BP 06/08/21 1105 114/70     Pulse Rate 06/08/21 1105 89     Resp 06/08/21 1105 18     Temp 06/08/21 1105 98.2 F (36.8 C)     Temp Source 06/08/21 1105 Oral     SpO2 06/08/21 1105 97 %     Weight --      Height --      Head Circumference --      Peak Flow --      Pain Score 06/08/21 1107 10     Pain Loc --      Pain Edu? --      Excl. in GC? --    No data found.  Updated Vital Signs BP 114/70 (BP  Location: Left Arm)   Pulse 89   Temp 98.2 F (36.8 C) (Oral)   Resp 18   SpO2 97%      Physical Exam Vitals and nursing note reviewed.  Constitutional:      General: She is not in acute distress.    Appearance: Normal appearance. She is not ill-appearing.  HENT:     Head: Normocephalic and atraumatic.     Right Ear: Tympanic membrane normal.     Left Ear: Tympanic membrane normal.     Nose: Congestion present.     Mouth/Throat:     Mouth: Mucous membranes are moist.     Pharynx: Posterior oropharyngeal erythema present. No oropharyngeal exudate.     Tonsils: 3+ on the right. 3+ on the left.  Eyes:     Conjunctiva/sclera: Conjunctivae normal.  Cardiovascular:     Rate and Rhythm: Normal rate and regular rhythm.     Heart sounds: Normal heart sounds. No murmur heard. Pulmonary:     Effort: Pulmonary effort is normal. No respiratory distress.     Breath sounds: Normal breath sounds. No wheezing, rhonchi or rales.  Skin:    General: Skin is warm and dry.  Neurological:     Mental Status: She is alert.  Psychiatric:        Mood and Affect: Mood normal.        Thought Content: Thought content normal.     UC Treatments / Results  Labs (all labs ordered are listed, but only abnormal results are displayed) Labs Reviewed  CULTURE, GROUP A STREP [facility])  POCT RAPID STREP A (OFFICE)    EKG   Radiology No results found.  Procedures Procedures (including critical care time)  Medications Ordered in UC Medications - No data to display  Initial Impression / Assessment and Plan / UC Course  I have reviewed the triage vital signs and the nursing notes.  Pertinent labs & imaging results that were available during my care of the patient were reviewed by me and considered in my medical decision making (see chart for details).  Rapid strep negative in office, will order strep culture.  In the meantime we will trial steroids for treatment.  Encouraged follow-up with any  further concerns or worsening symptoms.  Final Clinical Impressions(s) / UC Diagnoses   Final diagnoses:  Acute pharyngitis, unspecified etiology     Discharge Instructions      Try CEPACOL lozenges. This might help with throat discomfort. Follow up with any further concerns.      ED Prescriptions     Medication Sig Dispense Auth. Provider   predniSONE (DELTASONE) 20 MG tablet Take 2 tablets (40 mg total) by mouth daily with breakfast for 5 days. 10 tablet Tomi Bamberger, PA-C      PDMP not reviewed this encounter.   Tomi Bamberger, PA-C 06/08/21 1205

## 2021-06-11 LAB — CULTURE, GROUP A STREP (THRC)

## 2021-07-08 IMAGING — CT CT ABD-PELV W/ CM
2 of 4 series · 16 of 46 positions shown, 18 images · IV contrast (OMNIPAQUE 300)
Comparison: None.

CLINICAL DATA: Lower abdominal pain for the last year with
worsening over the last 2 weeks.

EXAM:
CT ABDOMEN AND PELVIS WITH CONTRAST
TECHNIQUE: Multidetector CT imaging of the abdomen and pelvis was performed
using the standard protocol following bolus administration of
intravenous contrast.
CONTRAST:  100mL OMNIPAQUE IOHEXOL 300 MG/ML  SOLN

[Series 2: abd/pel w · axial · 0.70mm/px · z∈[-432,-2]mm · 13 of 94 slices shown, 15 images]
[im 4/94  soft-tissue]
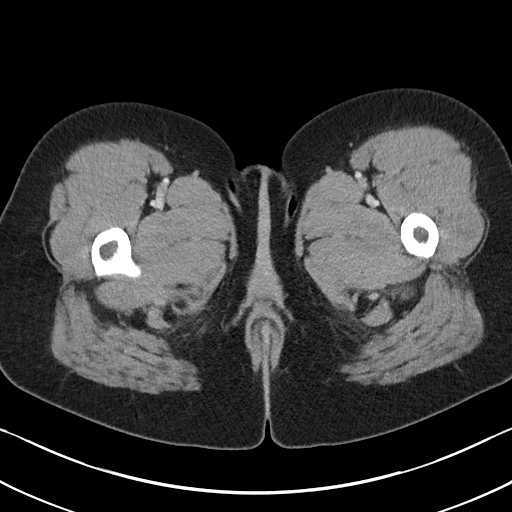
[im 4/94  bone]
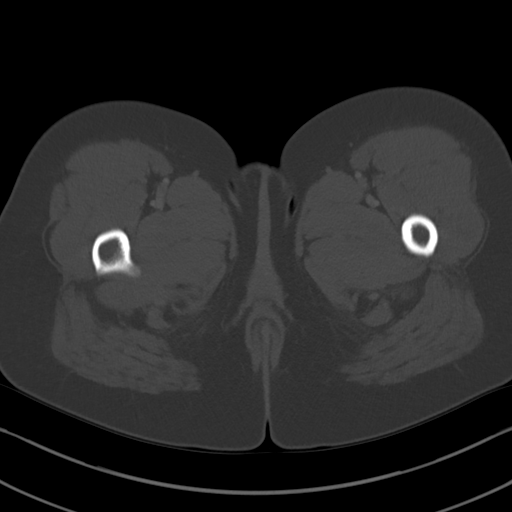
[im 12/94  soft-tissue]
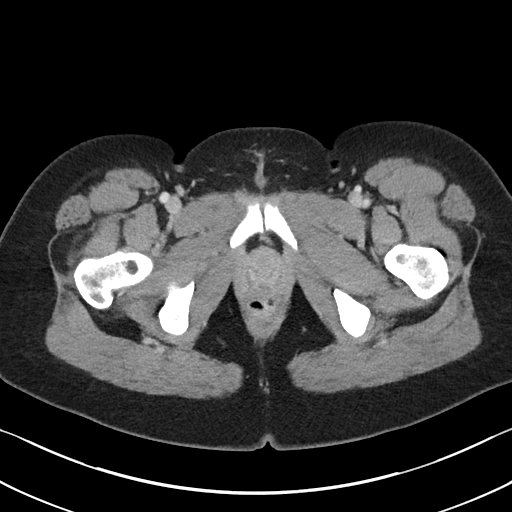
[im 20/94  soft-tissue]
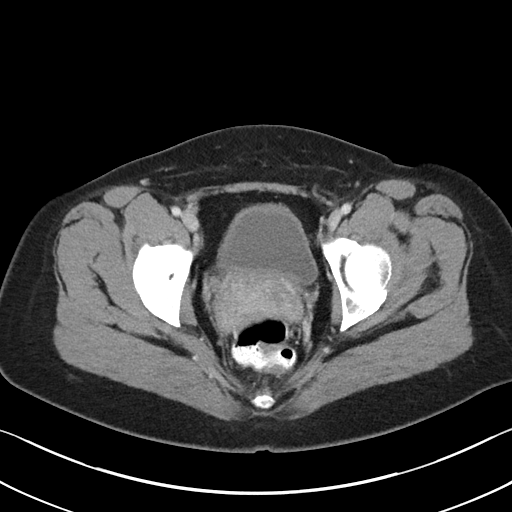
[im 28/94  soft-tissue]
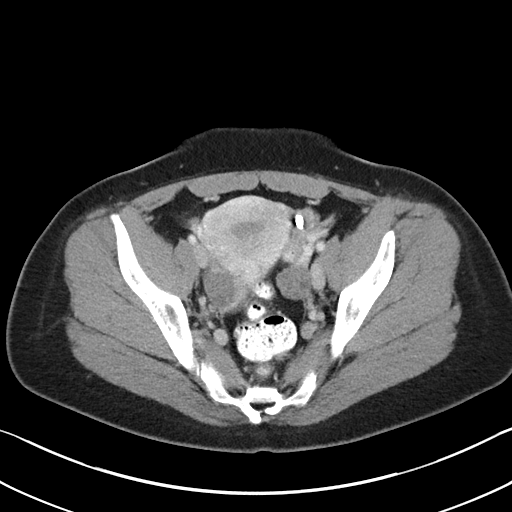
[im 32/94  soft-tissue]
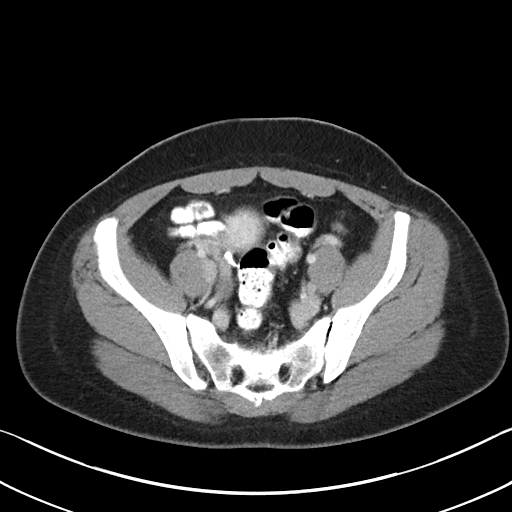
[im 39/94  soft-tissue]
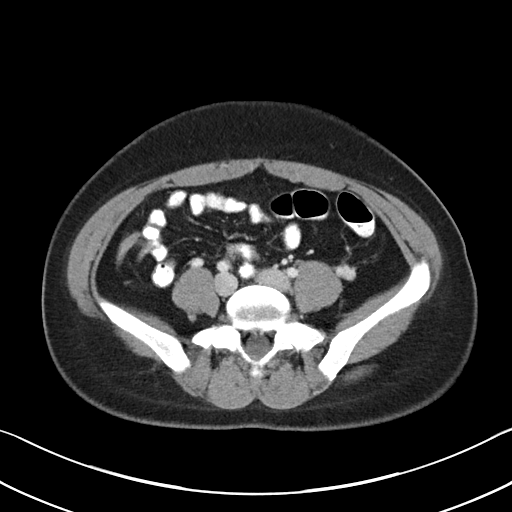
[im 47/94  soft-tissue]
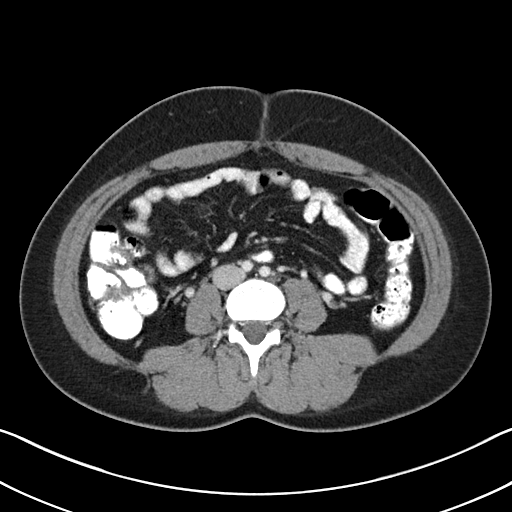
[im 55/94  soft-tissue]
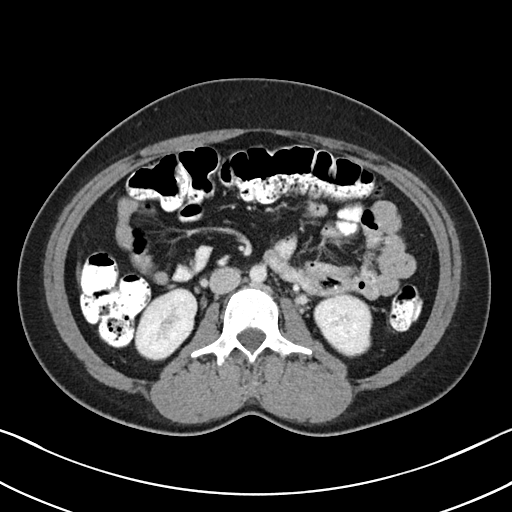
[im 63/94  soft-tissue]
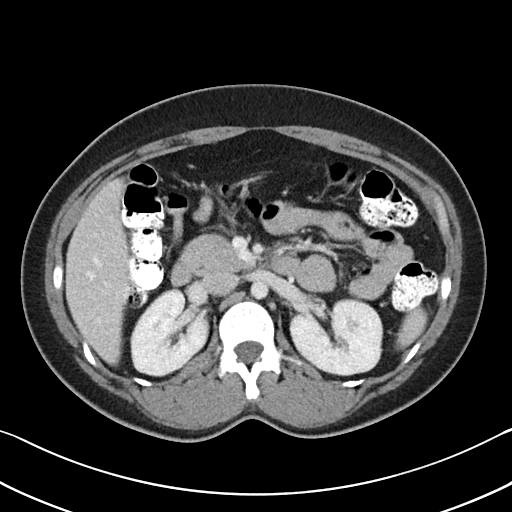
[im 63/94  bone]
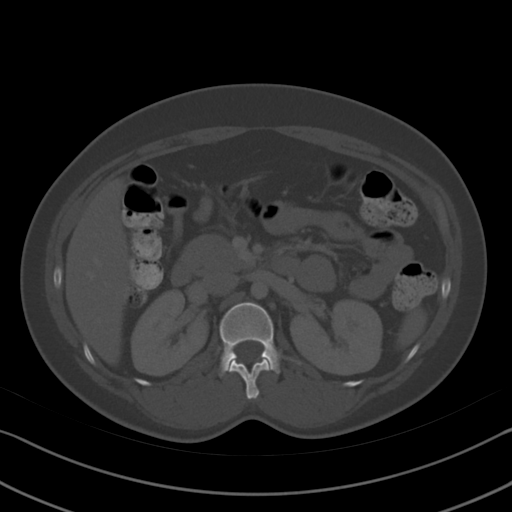
[im 66/94  soft-tissue]
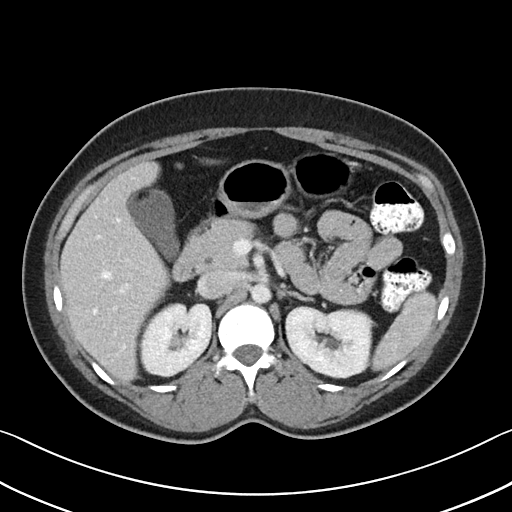
[im 74/94  soft-tissue]
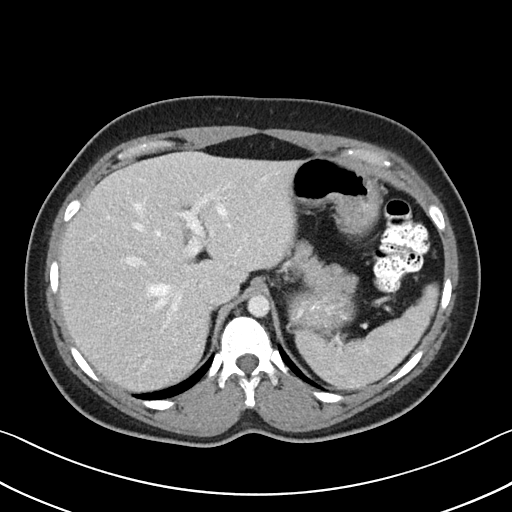
[im 82/94  soft-tissue]
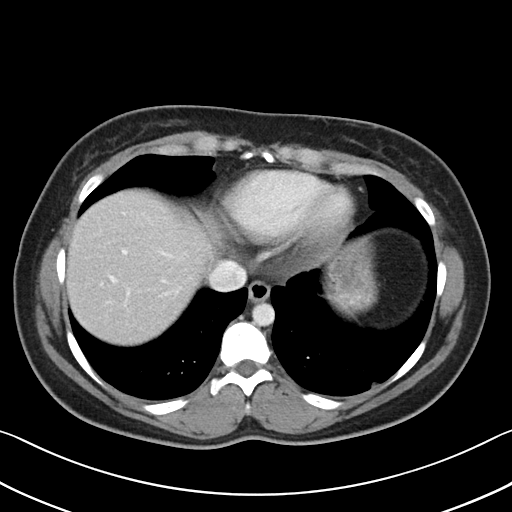
[im 90/94  soft-tissue]
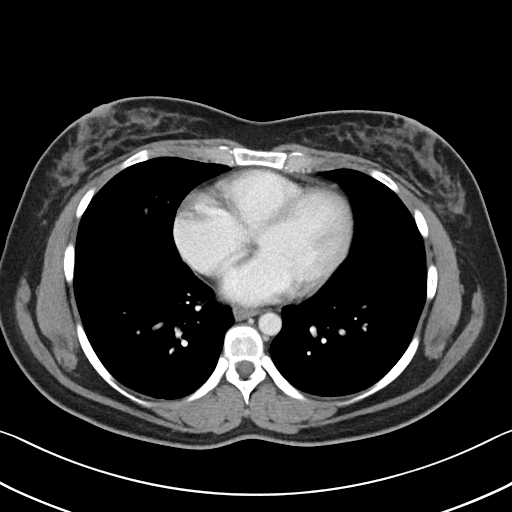

[Series 5: abd/pel w st · coronal · 0.68mm/px · 3 of 79 slices shown]
[im 27/79  soft-tissue]
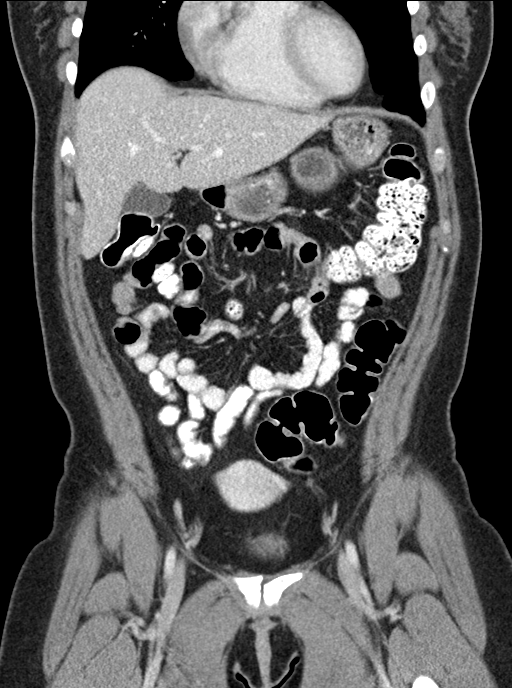
[im 35/79  soft-tissue]
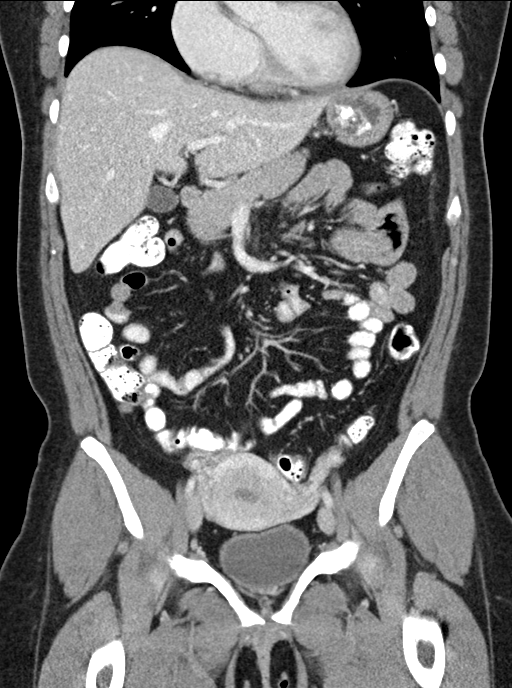
[im 44/79  soft-tissue]
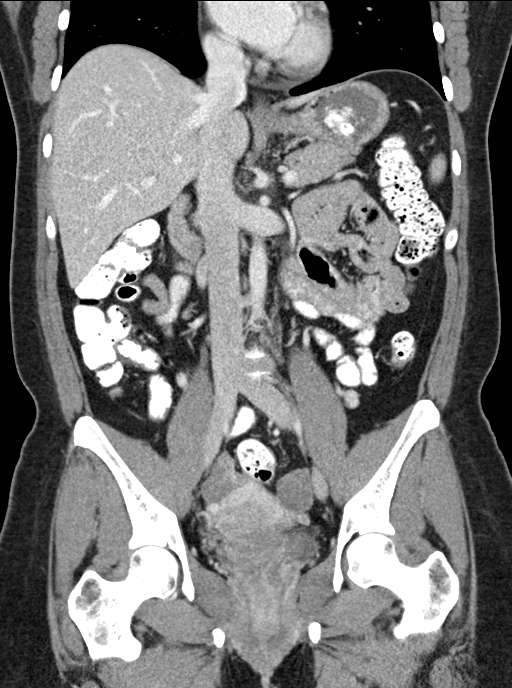

[16 of 46 positions shown; findings below may reference images not displayed]

FINDINGS: Lower chest: Unremarkable.

Hepatobiliary: No suspicious focal abnormality within the liver
parenchyma. There is no evidence for gallstones, gallbladder wall
thickening, or pericholecystic fluid. No intrahepatic or
extrahepatic biliary dilation.

Pancreas: No focal mass lesion. No dilatation of the main duct. No
intraparenchymal cyst. No peripancreatic edema.

Spleen: No splenomegaly. No focal mass lesion.

Adrenals/Urinary Tract: No adrenal nodule or mass. Kidneys
unremarkable. No evidence for hydroureter. The urinary bladder
appears normal for the degree of distention.

Stomach/Bowel: Stomach is unremarkable. No gastric wall thickening.
No evidence of outlet obstruction. Duodenum is normally positioned
as is the ligament of Treitz. No small bowel wall thickening. No
small bowel dilatation. The terminal ileum is normal. The appendix
is normal. No gross colonic mass. No colonic wall thickening.

Vascular/Lymphatic: No abdominal aortic aneurysm. No abdominal
aortic atherosclerotic calcification. There is no gastrohepatic or
hepatoduodenal ligament lymphadenopathy. No retroperitoneal or
mesenteric lymphadenopathy. No pelvic sidewall lymphadenopathy.

Reproductive: The uterus is unremarkable.  There is no adnexal mass.

Other: No intraperitoneal free fluid.

Musculoskeletal: No worrisome lytic or sclerotic osseous
abnormality.
IMPRESSION: No acute findings in the abdomen or pelvis. Specifically, no
findings to explain the patient's history of lower abdominal pain.

## 2021-08-11 ENCOUNTER — Other Ambulatory Visit: Payer: Self-pay

## 2021-08-11 ENCOUNTER — Emergency Department (HOSPITAL_COMMUNITY)
Admission: EM | Admit: 2021-08-11 | Discharge: 2021-08-11 | Disposition: A | Discharge status: Home / Self Care | Payer: Medicaid Other | Attending: Emergency Medicine | Admitting: Emergency Medicine

## 2021-08-11 ENCOUNTER — Emergency Department (HOSPITAL_COMMUNITY): Payer: Medicaid Other

## 2021-08-11 ENCOUNTER — Encounter (HOSPITAL_COMMUNITY): Payer: Self-pay | Admitting: Emergency Medicine

## 2021-08-11 DIAGNOSIS — R198 Other specified symptoms and signs involving the digestive system and abdomen: Secondary | ICD-10-CM

## 2021-08-11 DIAGNOSIS — T18108A Unspecified foreign body in esophagus causing other injury, initial encounter: Secondary | ICD-10-CM | POA: Insufficient documentation

## 2021-08-11 DIAGNOSIS — X58XXXA Exposure to other specified factors, initial encounter: Secondary | ICD-10-CM | POA: Insufficient documentation

## 2021-08-11 MED ORDER — PANTOPRAZOLE SODIUM 20 MG PO TBEC: 20.0000 mg | DELAYED_RELEASE_TABLET | Freq: Every day | ORAL | 0 refills | Status: DC

## 2021-08-11 MED ORDER — SUCRALFATE 1 GM/10ML PO SUSP
1.0000 g | Freq: Once | ORAL | Status: AC
  Administered 2021-08-11: 17:00:00 1 g via ORAL
  Filled 2021-08-11: qty 10

## 2021-08-11 MED ORDER — PANTOPRAZOLE SODIUM 20 MG PO TBEC
20.0000 mg | DELAYED_RELEASE_TABLET | Freq: Once | ORAL | Status: AC
  Administered 2021-08-11: 17:00:00 20 mg via ORAL
  Filled 2021-08-11 (×2): qty 1

## 2021-08-11 MED ORDER — SUCRALFATE 1 GM/10ML PO SUSP: 1.0000 g | Freq: Three times a day (TID) | ORAL | 0 refills | Status: DC

## 2021-08-11 NOTE — ED Provider Notes (Signed)
[facility] EMERGENCY DEPARTMENT Provider Note   CSN: [id number] Arrival date & time: 08/11/21  2330     History No chief complaint on file.   Cristina Burke is a 30 y.o. female.  HPI Patient was eating fried chicken bites yesterday around noon.  She was driving her car at the time.  She felt like a poorly chewed, rough piece got stuck in her throat.  She did not have anything to drink at the time to try to wash it down.  She reports since that time she has had a feeling of pain and scratchiness in her throat.  She was able to drink fluids yesterday and ate rice in the evening.  She has not had to spit out any fluids or vomit.  She does feel a bit of a persistent nauseated feeling and as if she may be should vomit but has not.  Difficulty breathing.  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  She reports has been there for over 5 years.  She reports it did get biopsied a number of years ago and she does not know what it is but apparently it was nothing serious.  She has not had any other associated symptoms with it.  She is disturbed by it at least cosmetically.    Past Medical History:  Diagnosis Date   Headache    Medical history non-contributory    No pertinent past medical history     Patient Active Problem List   Diagnosis Date Noted   Headache 10/16/2020   Low energy 10/16/2020   Cervical lymphadenopathy 10/16/2020   Iron deficiency without anemia 10/16/2020   Shakiness 08/15/2020    Past Surgical History:  Procedure Laterality Date   NO PAST SURGERIES     TUBAL LIGATION Bilateral 07/03/2016   Procedure: POST PARTUM TUBAL LIGATION;  Surgeon: Adam Phenix, MD;  Location: [facility];  Service: Gynecology;  Laterality: Bilateral;     OB History     Gravida  3   Para  3   Term  3   Preterm      AB      Living  3      SAB      IAB      Ectopic      Multiple  0   Live Births  3           Family  History  Problem Relation Age of Onset   Migraines Mother    Hypertension Mother    Diabetes Mother    Diabetes Sister    Diabetes Brother     Social History   Tobacco Use   Smoking status: Never   Smokeless tobacco: Never  Vaping Use   Vaping Use: Never used  Substance Use Topics   Alcohol use: No   Drug use: No    Home Medications Prior to Admission medications   Medication Sig Start Date End Date Taking? Authorizing Provider  pantoprazole (PROTONIX) 20 MG tablet Take 1 tablet (20 mg total) by mouth daily. 08/11/21  Yes Arby Barrette, MD  sucralfate (CARAFATE) 1 GM/10ML suspension Take 10 mLs (1 g total) by mouth 4 (four) times daily -  with meals and at bedtime. 08/11/21  Yes Arby Barrette, MD  iron polysaccharides (NU-IRON) 150 MG capsule Take 1 capsule (150 mg total) by mouth daily. Patient not taking: Reported on 05/16/2021 08/16/20   Yvette Rack, MD  rizatriptan (MAXALT-MLT) 5 MG disintegrating tablet Take 1 tablet (5 mg total) by mouth as needed for migraine. May repeat in 2 h prn, no more than 2 pills/24 h, no more than 3 pills/week. 05/16/21   Huston Foley, MD  FLUoxetine (PROZAC) 10 MG tablet Take 1 tablet (10 mg total) by mouth daily. 11/29/18 10/18/19  Elvina Sidle, MD  fluticasone (FLONASE) 50 MCG/ACT nasal spray Place 2 sprays into both nostrils daily. 06/03/19 10/18/19  Hall-Potvin, [place], PA-C  ipratropium (ATROVENT) 0.06 % nasal spray Place 2 sprays into both nostrils 4 (four) times daily. 01/04/19 10/18/19  Belinda Fisher, PA-C    Allergies    Feraheme [ferumoxytol]  Review of Systems   Review of Systems 10 Systems reviewed and negative except as per HPI Physical Exam Updated Vital Signs BP 109/71    Pulse 84    Temp 98.5 F (36.9 C)    Resp 15    LMP 08/03/2021    SpO2 100%   Physical Exam Constitutional:      Comments: Alert nontoxic well in appearance.  HENT:     Head: Normocephalic and atraumatic.     Mouth/Throat:     Mouth: Mucous membranes  are moist.     Pharynx: Oropharynx is clear.     Comments: Posterior oropharynx is widely patent.  Mucous membranes pink and moist.  Dentition good condition. Eyes:     Extraocular Movements: Extraocular movements intact.  Neck:     Comments: Neck is supple.  No stridor.  Patient does have a fairly large mass at the base of the neck on the right.  This is supraclavicular.  Is approximately 8 x 6 cm.  Its fairly smooth and spread out like a low convexity.  No overlying skin changes.  Somewhat mobile.  Does not appear matted or irregular. Cardiovascular:     Rate and Rhythm: Normal rate and regular rhythm.  Pulmonary:     Effort: Pulmonary effort is normal.     Breath sounds: Normal breath sounds.  Abdominal:     General: There is no distension.     Palpations: Abdomen is soft.     Tenderness: There is no abdominal tenderness.  Skin:    General: Skin is warm and dry.  Neurological:     General: No focal deficit present.     Mental Status: She is oriented to person, place, and time.     Coordination: Coordination normal.  Psychiatric:        Mood and Affect: Mood normal.    ED Results / Procedures / Treatments   Labs (all labs ordered are listed, but only abnormal results are displayed) Labs Reviewed - No data to display  EKG None  Radiology DG Neck Soft Tissue  Result Date: 08/11/2021 CLINICAL DATA:  Chief complaint: Pt states she was eating popcorn chicken yesterday and didn't have a drink. States it feels like a piece is stuck on the R side of throat. Drank water and coke without any improvements. States she was able to swallow but feels "discomfort" on R side of neck EXAM: NECK SOFT TISSUES - 1+ VIEW COMPARISON:  None. FINDINGS: There is no evidence of retropharyngeal soft tissue swelling or epiglottic enlargement. The cervical airway is unremarkable and no radio-opaque foreign body identified. IMPRESSION: Negative. Electronically Signed   By: Amie Portland M.D.   On:  08/11/2021 10:49    Procedures Procedures   Medications Ordered in ED Medications  sucralfate (CARAFATE) 1 GM/10ML suspension 1 g (  1 g Oral Given 08/11/21 1631)  pantoprazole (PROTONIX) EC tablet 20 mg (20 mg Oral Given 08/11/21 1631)    ED Course  I have reviewed the triage vital signs and the nursing notes.  Pertinent labs & imaging results that were available during my care of the patient were reviewed by me and considered in my medical decision making (see chart for details).  Clinical Course as of 08/11/21 1752  Sun Aug 11, 2021  1610 Consult: Reviewed with GI.  Jennye Moccasin.  She has reviewed the case with Dr. Adela Lank.  Plan for Carafate, PPI and lots of fluids.  Patient can follow-up in the office if she is still symptomatic within the next few days [MP]    Clinical Course User Index [MP] Arby Barrette, MD   MDM Rules/Calculators/A&P                         She swallowed a rough piece of food yesterday.  She has been able to eat soft foods and drink.  She is handling secretions well.  Voice is clear.  No respiratory distress.  Case reviewed with GI.  At this time plan will be for Carafate and PPI with follow-up at GI.  No indication at this time for urgent/emergent endoscopy.  Patient has an incidental large neck mass.  She describes it as having been evaluated possibly 5 years ago.  She describes it as being stable although cosmetically undesirable.  Typically does not cause any problems with swallowing or breathing.  Typically not painful.  Review of systems negative for worrying symptoms.  I suggest patient do a repeat follow-up with ENT for ongoing monitoring and counseling regarding need for repeat biopsy or surveillance versus cosmetic treatment.   Final Clinical Impression(s) / ED Diagnoses Final diagnoses:  Sensation of foreign body in esophagus    Rx / DC Orders ED Discharge Orders          Ordered    sucralfate (CARAFATE) 1 GM/10ML suspension  3 times daily  with meals & bedtime        08/11/21 1743    pantoprazole (PROTONIX) 20 MG tablet  Daily        08/11/21 1743             Arby Barrette, MD 08/11/21 1754

## 2021-08-11 NOTE — ED Notes (Signed)
Patient tolerating PO fluids at this time.

## 2021-08-11 NOTE — ED Provider Notes (Signed)
Emergency Medicine Provider Triage Evaluation Note  Cristina Burke , a 30 y.o. female  was evaluated in triage.  Pt complains of foreign body stuck in throat.  States yesterday morning she was eating popcorn chicken and did not have anything to drink, felt like a piece got stuck in the right upper part of her esophagus. She is able to eat and drink normally, tolerating her own secretions without difficulty. States she feels like she needs to vomit but is unable to.  Review of Systems  Positive:  Negative: Fevers, trouble swallowing  Physical Exam  BP 109/73 (BP Location: Right Arm)    Pulse 94    Temp 98.4 F (36.9 C) (Oral)    Resp 16    LMP 08/03/2021    SpO2 100%  Gen:   Awake, no distress   Resp:  Normal effort  MSK:   Moves extremities without difficulty  Other:    Medical Decision Making  Medically screening exam initiated at 9:51 AM.  Appropriate orders placed.  Meridian Scherger was informed that the remainder of the evaluation will be completed by another provider, this initial triage assessment does not replace that evaluation, and the importance of remaining in the ED until their evaluation is complete.     Vear Clock 08/11/21 4944    Margarita Grizzle, MD 08/12/21 [phone]

## 2021-08-11 NOTE — Discharge Instructions (Signed)
1.  Take Carafate 3 times daily before eating anything and once at night before bed.  Take Protonix once daily 2.  Only eat liquids and soft foods. 3.  If your symptoms are not significantly improved by tomorrow, call the gastroenterologist Dr. Adela Lank listed in your discharge instructions. 4.  I also suggest you get a follow-up recheck on your longstanding neck mass.  You can go back to the group you saw at [facility] or call [facility] for an appointment.

## 2021-08-11 NOTE — ED Triage Notes (Addendum)
Pt states she was eating popcorn chicken yesterday and didn't have a drink.  States it feels like a piece is stuck on the R side of throat.  Drank water and coke without any improvements.  States she was able to swallow but feels "discomfort" on R side of neck.  Handling secretions.  No SOB.

## 2021-09-03 ENCOUNTER — Ambulatory Visit: Payer: Medicaid Other | Admitting: Gastroenterology

## 2021-09-13 ENCOUNTER — Ambulatory Visit: Payer: Self-pay | Admitting: Gastroenterology

## 2021-09-19 ENCOUNTER — Encounter: Payer: Self-pay | Admitting: Neurology

## 2021-09-19 ENCOUNTER — Ambulatory Visit: Payer: Self-pay | Admitting: Neurology

## 2022-02-19 NOTE — Progress Notes (Signed)
02/21/2022 Cristina Burke [id number] [birth year]  Referring provider: No ref. provider found Primary GI doctor: Dr. Barron Alvine  ASSESSMENT AND PLAN:   Iron deficiency anemia, unspecified iron deficiency anemia type Has heavy menses, not able to tolerate oral iron due to constipation. Possible reaction to IV iron.  Will recheck, can consider endoscopic evaluation with symptoms if continuing iron def.  -     CBC with Differential/Platelet; Future -     IBC + Ferritin; Future  Gastroesophageal reflux disease without esophagitis -     Comprehensive metabolic panel; Future -     Tissue transglutaminase, IgA; Future -     IgA; Future -     H. pylori antibody, IgG; Future Denies symptoms.  Lifestyle changes discussed, avoid NSAIDS, ETOH Patient prefers to not be on medications if not needed, will try diet  Generalized abdominal pain with constipation, better with greens, Bloating CT AB and pelvis unremarkable 2021 -     Sedimentation rate; Future -     High sensitivity CRP; Future Agree with possible IBS/menstrual related Follow up with GYN for pap/evaluation Try IBGard, FODMAP Can consider trying RX constipation  Bloating Will check for celiac.  Will treat constipation/IBS with IBGard  Given FODMAP information and discussed diet.      History of Present Illness:  31 y.o. female  with a past medical history of Migraines, neck mass evaluation with ultrasound and needle biopsy and others listed below, returns to clinic today for evaluation of bloating, constipation.  04/16/2020 saw Amy Esterwood in the office for lower abdominal pain Sed rate 28 CBC without anemia, leukocytosis CRP unremarkable kidney and liver unremarkable. 04/23/2020 CT abdomen pelvis with contrast no acute findings for abdominal or pelvis pain. That time patient was having some urinary issues, seem to be around menstrual period.  Sent to gynecology to evaluate for potential endometriosis versus  IBS. 07/2020 IDA with iron 58, iron sat 12, ferritin 8, started on iron.  09/2020 persistent IDA ferritin 8 She does have menses, once a month, lasts for 5 days, can use up to 6- 8 pads a day, worse day is 3rd, can be lighter after that.  She is not on an iron supplement at this time, has not taken in 1 years. Had reaction to iron infusion, has sweating, palpitation, ? Anxiety.  She has muscle aches, restless leg, and some fatigue.   08/11/2021 ER [facility] for sensation of foreign body stuck in throat.   Thought she had chicken in her throat.  Had large chronic neck mass right base.   Advised PPI, Carafate, liquids and follow-up.   10/14/2021 otolaryngologist at [facility] for foreign body sensation in throat occurs randomly.  States at that time foreign body sensation resolved.  Denies dysphagia, odynophagia denies heartburn or indigestion. Laryngoscopy showed mild edema and erythema normal vocal cord, normal hypopharynx findings consistent with LPR.  States 2 weeks before menses still has RLQ AB pain, last one week. Has vaginal discharge, feels urinary spasms feels she has to urinate but does not have to or can have some leakage of urine during this time. Has not see GYN.  She has BM once a day, in the AM, mainly formed stool.  Can have pebbles but mainly loose stools, incomplete bowel movement. Does have straining. Denies hematochezia. No rectal pain. Drinks "greens" and this helps having BM daily..  States has bloating with even water or eating anything.  Has nausea but no vomiting. She denies GERD. No dysphagia, no odyophagia,  just that one episode in Dec.  No coughing, clearing of throat.  No NSAIDS. No ETOH.   Current Medications:  No current outpatient medications on file.  Surgical History:  She  has a past surgical history that includes Tubal ligation (Bilateral, 07/03/2016). Family History:  Her family history includes Diabetes in her brother, mother, and sister; Hypertension in  her mother; Migraines in her mother. Social History:   reports that she has never smoked. She has never used smokeless tobacco. She reports that she does not drink alcohol and does not use drugs.  Current Medications, Allergies, Past Medical History, Past Surgical History, Family History and Social History were reviewed in [employer] record.  Physical Exam: BP 110/70   Pulse 84   Ht 5\' 1"  (1.549 m)   Wt 162 lb (73.5 kg)   LMP 02/20/2022   BMI 30.61 kg/m  General:   Pleasant, well developed female in no acute distress Heart : Regular rate and rhythm; no murmurs Pulm: Clear anteriorly; no wheezing Abdomen:  Soft, Obese, non distended AB, Active bowel sounds. mild tenderness in the lower abdomen ( on menses). Without guarding and Without rebound, No organomegaly appreciated. Rectal: Normal external rectal exam, normal rectal tone, no internal hemorrhoids appreciated, no masses,tenderness, scant brown stool, hemoccult Negative Extremities:  without  edema. Neurologic:  Alert and  oriented x4;  No focal deficits.  Psych:  Cooperative. Normal mood and affect.   02/22/2022, PA-C 02/21/22

## 2022-02-21 ENCOUNTER — Ambulatory Visit (INDEPENDENT_AMBULATORY_CARE_PROVIDER_SITE_OTHER): Payer: Self-pay | Admitting: Physician Assistant

## 2022-02-21 ENCOUNTER — Other Ambulatory Visit (INDEPENDENT_AMBULATORY_CARE_PROVIDER_SITE_OTHER): Payer: Self-pay

## 2022-02-21 ENCOUNTER — Encounter: Payer: Self-pay | Admitting: Physician Assistant

## 2022-02-21 VITALS — BP 110/70 | HR 84 | Ht 61.0 in | Wt 162.0 lb

## 2022-02-21 DIAGNOSIS — K219 Gastro-esophageal reflux disease without esophagitis: Secondary | ICD-10-CM

## 2022-02-21 DIAGNOSIS — R1084 Generalized abdominal pain: Secondary | ICD-10-CM

## 2022-02-21 DIAGNOSIS — K581 Irritable bowel syndrome with constipation: Secondary | ICD-10-CM

## 2022-02-21 DIAGNOSIS — R140 Abdominal distension (gaseous): Secondary | ICD-10-CM

## 2022-02-21 DIAGNOSIS — D509 Iron deficiency anemia, unspecified: Secondary | ICD-10-CM

## 2022-02-21 LAB — CBC WITH DIFFERENTIAL/PLATELET
Basophils Absolute: 0.1 10*3/uL (ref 0.0–0.1)
Basophils Relative: 1 % (ref 0.0–3.0)
Eosinophils Absolute: 0.2 10*3/uL (ref 0.0–0.7)
Eosinophils Relative: 2.5 % (ref 0.0–5.0)
HCT: 32 % — ABNORMAL LOW (ref 36.0–46.0)
Hemoglobin: 9.9 g/dL — ABNORMAL LOW (ref 12.0–15.0)
Lymphocytes Relative: 20 % (ref 12.0–46.0)
Lymphs Abs: 1.3 10*3/uL (ref 0.7–4.0)
MCHC: 31.1 g/dL (ref 30.0–36.0)
MCV: 75 fl — ABNORMAL LOW (ref 78.0–100.0)
Monocytes Absolute: 0.6 10*3/uL (ref 0.1–1.0)
Monocytes Relative: 9.6 % (ref 3.0–12.0)
Neutro Abs: 4.4 10*3/uL (ref 1.4–7.7)
Neutrophils Relative %: 66.9 % (ref 43.0–77.0)
Platelets: 283 10*3/uL (ref 150.0–400.0)
RBC: 4.26 Mil/uL (ref 3.87–5.11)
RDW: 16.7 % — ABNORMAL HIGH (ref 11.5–15.5)
WBC: 6.6 10*3/uL (ref 4.0–10.5)

## 2022-02-21 LAB — COMPREHENSIVE METABOLIC PANEL
ALT: 11 U/L (ref 0–35)
AST: 13 U/L (ref 0–37)
Albumin: 4.1 g/dL (ref 3.5–5.2)
Alkaline Phosphatase: 78 U/L (ref 39–117)
BUN: 21 mg/dL (ref 6–23)
CO2: 29 mEq/L (ref 19–32)
Calcium: 9 mg/dL (ref 8.4–10.5)
Chloride: 106 mEq/L (ref 96–112)
Creatinine, Ser: 0.79 mg/dL (ref 0.40–1.20)
GFR: 99.89 mL/min (ref >60.00)
Glucose, Bld: 91 mg/dL (ref 70–99)
Potassium: 3.8 mEq/L (ref 3.5–5.1)
Sodium: 140 mEq/L (ref 135–145)
Total Bilirubin: 0.3 mg/dL (ref 0.2–1.2)
Total Protein: 7.6 g/dL (ref 6.0–8.3)

## 2022-02-21 LAB — IBC + FERRITIN
Ferritin: 5.5 ng/mL — ABNORMAL LOW (ref 10.0–291.0)
Iron: 32 ug/dL — ABNORMAL LOW (ref 42–145)
Saturation Ratios: 5.3 % — ABNORMAL LOW (ref 20.0–50.0)
TIBC: 599.2 ug/dL — ABNORMAL HIGH (ref 250.0–450.0)
Transferrin: 428 mg/dL — ABNORMAL HIGH (ref 212.0–360.0)

## 2022-02-21 LAB — HIGH SENSITIVITY CRP: CRP, High Sensitivity: 8.74 mg/L — ABNORMAL HIGH (ref 0.000–5.000)

## 2022-02-21 LAB — H. PYLORI ANTIBODY, IGG: H Pylori IgG: NEGATIVE

## 2022-02-21 LAB — SEDIMENTATION RATE: Sed Rate: 42 mm/hr — ABNORMAL HIGH (ref 0–20)

## 2022-02-21 NOTE — Patient Instructions (Addendum)
If you are age 31 or younger, your body mass index should be between 19-25. Your Body mass index is 30.61 kg/m. If this is out of the aformentioned range listed, please consider follow up with your Primary Care Provider.  ________________________________________________________  The  GI providers would like to encourage you to use [facility] to communicate with providers for non-urgent requests or questions.  Due to long hold times on the telephone, sending your provider a message by [facility] may be a faster and more efficient way to get a response.  Please allow 48 business hours for a response.  Please remember that this is for non-urgent requests.  _______________________________________________________   Your provider has requested that you go to the basement level for lab work before leaving today. Press "B" on the elevator. The lab is located at the first door on the left as you exit the elevator.   FOLLOW UP GYN- needs pap smear!!!!   Avoid spicy and acidic foods Avoid fatty foods Limit your intake of coffee, tea, alcohol, and carbonated drinks Work to maintain a healthy weight Keep the head of the bed elevated at least 3 inches with blocks or a wedge pillow if you are having any nighttime symptoms Stay upright for 2 hours after eating Avoid meals and snacks three to four hours before bedtime  Please try low FODMAP diet Try trial off milk/lactose products.  Increase activity Can do trial of IBGard for AB pain- Take 1-2 capsules once a day for maintence or twice a day during a flare Please try to decrease stress.    FODMAP stands for fermentable oligo-, di-, mono-saccharides and polyols (1). These are the scientific terms used to classify groups of carbs that are notorious for triggering digestive symptoms like bloating, gas and stomach pain.   FODMAPs are found in a wide range of foods in varying amounts. Some foods contain just one type, while others contain several.  The  main dietary sources of the four groups of FODMAPs include:  Oligosaccharides: Wheat, rye, legumes and various fruits and vegetables, such as garlic and onions.  Disaccharides: Milk, yogurt and soft cheese. Lactose is the main carb.  Monosaccharides: Various fruit including figs and mangoes, and sweeteners such as honey and agave nectar. Fructose is the main carb.  Polyols: Certain fruits and vegetables including blackberries and lychee, as well as some low-calorie sweeteners like those in sugar-free gum.     Recommend starting on a fiber supplement, can try metamucil first but if this causes gas/bloating switch to benefiber or citracel, these do not cause gas.  Take with fiber with with a full 8 oz glass of water once a day. This can take 1 month to start helping, so try for at least one month.  Recommend increasing water and physical activity.  Get a squatty potty to use at home or try a stool, goal is to get your knees above your hips during a bowel movement.  - Drink at least 64-80 ounces of water/liquid per day. - Establish a time to try to move your bowels every day.  For many people, this is after a cup of coffee or after a meal such as breakfast. - Sit all of the way back on the toilet keeping your back fairly straight and while sitting up, try to rest the tops of your forearms on your upper thighs.   - Raising your feet with a step stool/squatty potty can be helpful to improve the angle that allows your stool to  pass through the rectum. - Relax the rectum feeling it bulge toward the toilet water.  If you feel your rectum raising toward your body, you are contracting rather than relaxing. - Breathe in and slowly exhale. "Belly breath" by expanding your belly towards your belly button. Keep belly expanded as you gently direct pressure down and back to the anus.  A low pitched GRRR sound can assist with increasing intra-abdominal pressure.  - Repeat 3-4 times. If unsuccessful, contract  the pelvic floor to restore normal tone and get off the toilet.  Avoid excessive straining. - To reduce excessive wiping by teaching your anus to normally contract, place hands on outer aspect of knees and resist knee movement outward.  Hold 5-10 second then place hands just inside of knees and resist inward movement of knees.  Hold 5 seconds.  Repeat a few times each way.  Go to the ER if unable to pass gas, severe AB pain, unable to hold down food, any shortness of breath of chest pain.

## 2022-02-24 ENCOUNTER — Other Ambulatory Visit: Payer: Self-pay

## 2022-02-24 ENCOUNTER — Telehealth: Payer: Self-pay | Admitting: Physician Assistant

## 2022-02-24 DIAGNOSIS — K581 Irritable bowel syndrome with constipation: Secondary | ICD-10-CM

## 2022-02-24 DIAGNOSIS — D509 Iron deficiency anemia, unspecified: Secondary | ICD-10-CM

## 2022-02-24 DIAGNOSIS — R1084 Generalized abdominal pain: Secondary | ICD-10-CM

## 2022-02-24 DIAGNOSIS — K219 Gastro-esophageal reflux disease without esophagitis: Secondary | ICD-10-CM

## 2022-02-24 LAB — IGA: Immunoglobulin A: 198 mg/dL (ref 47–310)

## 2022-02-24 LAB — TISSUE TRANSGLUTAMINASE, IGA: (tTG) Ab, IgA: <1 U/mL

## 2022-02-24 NOTE — Telephone Encounter (Signed)
Patient scheduled for endo colon on 03/26/22 at 2:30 pm with Dr. Barron Alvine in the [facility]. Amb ref placed. Patient is not on any blood thinners or diabetic. Instructions have been provided over the phone, as well as mailed home. Patient advised to call back with any questions.

## 2022-02-24 NOTE — Telephone Encounter (Signed)
Patient returned your call, please advise. 

## 2022-03-05 NOTE — Progress Notes (Signed)
Agree with the assessment and plan as outlined by Quentin Mulling, PA-C.  If continued iron deficiency anemia, I think endoscopic evaluation with EGD/colonoscopy is very reasonable.  Would be able to evaluate for potential GI source of IDA along with mucosal/luminal pathology to explain other GI symptoms.  Agree that GYN referral beneficial for evaluation of menorrhagia as potentially source (or at least contributor) of IDA.  Mung Rinker, DO, [facility]

## 2022-03-26 ENCOUNTER — Ambulatory Visit: Payer: Medicaid Other | Admitting: Gastroenterology

## 2022-03-26 DIAGNOSIS — K219 Gastro-esophageal reflux disease without esophagitis: Secondary | ICD-10-CM

## 2022-03-26 DIAGNOSIS — D509 Iron deficiency anemia, unspecified: Secondary | ICD-10-CM

## 2022-03-26 NOTE — Progress Notes (Signed)
Rescheduled patient. Patient did not prep.

## 2022-03-26 NOTE — Progress Notes (Signed)
Pt arrived for appointment today and was not prepped for colonoscopy. Pt states she does not remember reviewing the instructions over the phone or receiving them in the mail per phone note on chart. Pt rescheduled for 04/10/22 at 1:30p. New instructions printed and reviewed in detail with the pt. All questions answered. Pt verbalized understanding. Pt denies taking any blood thinners or weight loss medications. States she was just seen by her PCP and they started her on Iron and medication for depression. Pt knows to stop Iron 5 days before the procedure.

## 2022-03-27 ENCOUNTER — Ambulatory Visit (INDEPENDENT_AMBULATORY_CARE_PROVIDER_SITE_OTHER): Payer: Self-pay | Admitting: Neurology

## 2022-03-27 ENCOUNTER — Telehealth: Payer: Self-pay | Admitting: Neurology

## 2022-03-27 VITALS — BP 114/70 | HR 74 | Ht 63.0 in | Wt 164.0 lb

## 2022-03-27 DIAGNOSIS — G43009 Migraine without aura, not intractable, without status migrainosus: Secondary | ICD-10-CM

## 2022-03-27 DIAGNOSIS — Z9189 Other specified personal risk factors, not elsewhere classified: Secondary | ICD-10-CM

## 2022-03-27 MED ORDER — RIZATRIPTAN BENZOATE 10 MG PO TBDP: 10.0000 mg | ORAL_TABLET | ORAL | 3 refills | Status: DC | PRN

## 2022-03-27 NOTE — Telephone Encounter (Signed)
self-pay sent to GI  

## 2022-03-27 NOTE — Progress Notes (Signed)
Subjective:    Patient ID: Cristina Burke is a 31 y.o. female.  HPI    Interim history:   Cristina Burke is a 31 year old right-handed woman with an underlying benign medical history, who presents for follow-up consultation of her recurrent headaches.  The patient is unaccompanied today. She missed an appointment on 09/19/2021. I first met her at the request of the ER on 05/16/2021, at which time she reported a several year history of recurrent migrainous headaches.  We talked about preventative and acute migraine management.  She was advised to start Maxalt as needed.  She was encouraged to seek an updated eye examination.  She was advised to proceed with sleep evaluation with a sleep study and a brain MRI.  She did not pursue the tests.  Today, 03/27/22: She reports that she did not pursue the sleep test of the MRI.  She currently does not have insurance but would be willing to pursue these tests.  She has not had a formal eye examination as we discussed last time.  She has not been taking her Maxalt consistently for headaches as needed, was not fully sure if it was helpful, she does not currently have a prescription for it, she would be willing to increase the dose, she had no side effects from it.  She has noticed increased migraine tendency before her menstrual cycle.  She tries to hydrate well and tries to avoid caffeine.  She has mostly left-sided headaches, occasional nausea but sometimes she has nausea and bloating without headaches and has seen GI.  She has not had any blurry vision or double vision.  Frequency of her migraines is about 4/month.  She has seen a primary care provider through [employer].  She had blood work but does not know the results.  The patient's allergies, current medications, family history, past medical history, past social history, past surgical history and problem list were reviewed and updated as appropriate.   Previously:   05/16/21: (She) reports  recurrent headaches for the past several years.  She has not been on any prescription medication for her headaches.  She had talked to her primary care provider about her headaches and was advised to use over-the-counter medications.  She uses Excedrin and has tried nausea medicine but does not recall the name of it.  She typically has a left-sided headache, it is throbbing and associated with nausea but no vomiting, often light sensitivity.  She has not had a formal eye examination since high school.  She does not have any prescription eyeglasses, no visual symptoms typically including no blurry vision or double vision or loss of vision, no visual aura.  She does have light sensitivity.  She estimates that she gets about 2-3 headaches in a week.  She has had worsening in the past year.  She has not had any sudden onset of one-sided weakness or numbness or tingling or droopy face or slurring of speech.  She has a family history of migraines affecting her mom.  She sleeps fairly well but has woken up up to 3 times a week with a headache which is typically in the right frontal and periorbital area.  She does not typically take anything first thing in the morning to help her headache.  She has occasional nocturia, not nightly.  She is not sure if she snores.  She lives with her boyfriend and her 3 children but she does not sleep in the same bedroom with her boyfriend.  She is  a non-smoker and does not drink any alcohol.  She drinks caffeine in limitation, usually 1 soda per day.  She tries to hydrate well with water.  She works as a Estate agent.   She presented to the emergency room at [facility] on 12/16/2020 with a headache.  She also reported nausea and feeling dizzy.  She reported a several month history of recurrent headaches.  I reviewed the emergency room records.  She had a head CT without contrast on 12/17/2020 and I reviewed the results: IMPRESSION: No acute intracranial abnormalities. She did not  receive any treatment in the emergency room.  She has not seen her primary care provider since then.   I reviewed a phone note from Dr. Alfonse Spruce from 10/31/2020.  She was offered preventative migraine medication but declined at the time.  She requested a CT scan to be done.  She had a head CT without contrast on 12/17/2020 and I reviewed the results:   IMPRESSION: No acute intracranial abnormalities.   Her Epworth sleepiness score is 9 out of 24, fatigue severity score is 38 out of 63.    When she saw Dr. Eileen Stanford in February 2022 she was advised to use NSAIDs as needed, ophthalmology referral was discussed at the time.  Her Past Medical History Is Significant For: Past Medical History:  Diagnosis Date   Anemia    Laryngopharyngeal reflux (LPR)    Migraine    Vertigo     Her Past Surgical History Is Significant For: Past Surgical History:  Procedure Laterality Date   TUBAL LIGATION Bilateral 07/03/2016   Procedure: POST PARTUM TUBAL LIGATION;  Surgeon: Woodroe Mode, MD;  Location: [facility];  Service: Gynecology;  Laterality: Bilateral;    Her Family History Is Significant For: Family History  Problem Relation Age of Onset   Migraines Mother    Hypertension Mother    Diabetes Mother    Diabetes Sister    Diabetes Brother     Her Social History Is Significant For: Social History   Socioeconomic History   Marital status: Single    Spouse name: Not on file   Number of children: 3   Years of education: Not on file   Highest education level: Not on file  Occupational History   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  Work: [employer].  Boyfriend/  3  kids.    Social Determinants of Health   Financial Resource Strain: Not on file  Food Insecurity: Not on file  Transportation Needs: Not on file  Physical Activity: Not on file  Stress: Not on file  Social Connections: Not on file    Her Allergies Are:  Allergies  Allergen Reactions   Feraheme [Ferumoxytol] Shortness Of Breath    Head and chest pressure  :   Her Current Medications Are:  No outpatient encounter medications on file as of 03/27/2022.   No facility-administered encounter medications on file as of 03/27/2022.  :  Review of Systems:  Out of a complete 14 point review of systems, all are reviewed and negative with the exception  of these symptoms as listed below:  Review of Systems  Neurological:        Here f/u on migraines pt reports migraines have increased since last f/u. Stopped rizatriptan did not see any benefit.    Objective:  Neurological Exam  Physical Exam Physical Examination:   Vitals:   03/27/22 1323  BP: 114/70  Pulse: 74    General Examination: The patient is a very pleasant 31 y.o. female in no acute distress. She appears well-developed and well-nourished and well groomed.   HEENT: Normocephalic, atraumatic, pupils are equal, round and reactive to light, extraocular tracking is well-preserved, face is symmetric with normal facial animation.  Hearing grossly intact, no lip, neck or jaw tremor, no carotid bruits. Oropharynx exam reveals: mild mouth dryness, good dental hygiene and moderate airway crowding, due to tonsillar size of about 2+, slightly wider uvula which is bicuspid.  Tongue protrudes centrally and palate elevates symmetrically.   Chest: Clear to auscultation without wheezing, rhonchi or crackles noted.   Heart: S1+S2+0, regular and normal without murmurs, rubs or gallops noted.    Abdomen: Soft, non-tender and non-distended.   Extremities: There is no pitting edema in the distal lower extremities bilaterally.    Skin: Warm  and dry without trophic changes noted.    Musculoskeletal: exam reveals no obvious joint deformities.    Neurologically:  Mental status: The patient is awake, alert and oriented in all 4 spheres. Her immediate and remote memory, attention, language skills and fund of knowledge are appropriate. There is no evidence of aphasia, agnosia, apraxia or anomia. Speech is clear with normal prosody and enunciation. Thought process is linear. Mood is normal and affect is normal.  Cranial nerves II - XII are as described above under HEENT exam.  Motor exam: Normal bulk, strength and tone is noted. There is no tremor, drift or rebound.  Romberg is negative.  Reflexes are 2+ throughout, toes are downgoing bilaterally.  Fine motor skills and coordination good in the upper and lower extremities with good hand movements and foot movements.  Cerebellar testing shows no dysmetria or intention tremor, normal finger-to-nose and normal heel-to-shin noted.  Sensory exam is intact to light touch.  Gait, station and balance: She stands without difficulty, posture is normal, she walks without problems, normal tandem walk.   Assessment and Plan:    In summary, Latina Frank is a very pleasant 31 year old female with an underlying benign medical history, who presents as a referral from the emergency room for a several year history of recurrent headaches.  She has a benfor follow-up consultation of her recurrent migraine headaches.  She has approximately 4 migraines per month.  She has tried low-dose Maxalt and is agreeable to increasing it to 10 mg strength.  She is advised to pursue additional testing as we had discussed last time.  In particular, I would like to exclude obstructive sleep apnea, we mutually agreed to pursue a home sleep test at this time. She has not had any updated eye examination in years.  She is advised to seek a formal dilated eye exam through an optometrist or ophthalmologist of her choosing.  She has  woken up with a headache.  She may be at risk of obstructive sleep apnea given her crowded airway overweight state. We mutually agreed to go ahead and proceed with a brain MRI with and without contrast. She is advised to follow-up routinely in 3 to 4 months to see one of our nurse practitioners. We will  keep her posted as to her test results by phone call in the interim and consider treatment for sleep apnea if she qualifies.  She is advised to call our office in about 2 weeks if she has not heard about scheduling for an MRI and/or the sleep test.  We talked about typical migraine triggers again today.  I answered all her questions today and she was in agreement.  I spent 30 minutes in total face-to-face time and in reviewing records during pre-charting, more than 50% of which was spent in counseling and coordination of care, reviewing test results, reviewing medications and treatment regimen and/or in discussing or reviewing the diagnosis of Migraine without aura, the prognosis and treatment options. Pertinent laboratory and imaging test results that were available during this visit with the patient were reviewed by me and considered in my medical decision making (see chart for details).

## 2022-03-27 NOTE — Patient Instructions (Signed)
It was nice to see you again today.  As discussed, for an acute migraine, I recommend you use Maxalt 10 mg strength: take 1 pill early on when you suspect a migraine attack come on. You may take another pill within 2 hours, no more than 2 pills in 24 hours. Most people who take triptans do not have any serious side-effects. However, they can cause drowsiness (remember to not drive or use heavy machinery when drowsy), nausea, dizziness, dry mouth. Less common side effects include strange sensations, such as tightness in your chest or throat, tingling, flushing, and feelings of heaviness or pressure in areas such as the face, limbs, and chest. These in the chest can mimic heart related pain (angina) and may cause alarm, but usually these sensations are not harmful or a sign of a heart attack. However, if you develop intense chest pain or sensations of discomfort, you should stop taking your medication and consult with me or your PCP or go to the nearest urgent care facility or ER or call 911.    Based on your symptoms and your exam I believe you may be at risk for obstructive sleep apnea (aka OSA), and I think we should proceed with a sleep study to determine whether you do or do not have OSA and how severe it is. Even, if you have mild OSA, I may want you to consider treatment with CPAP, as treatment of even borderline or mild sleep apnea can result and improvement of symptoms such as sleep disruption, daytime sleepiness, nighttime bathroom breaks, restless leg symptoms, improvement of headache syndromes, even improved mood disorder.    Please remember, the long-term risks and ramifications of untreated moderate to severe obstructive sleep apnea are: increased Cardiovascular disease, including congestive heart failure, stroke, difficult to control hypertension, treatment resistant obesity, arrhythmias, especially irregular heartbeat commonly known as A. Fib. (atrial fibrillation); even type 2 diabetes has been  linked to untreated OSA.    Your neurological exam is normal thankfully.    As discussed, I will order a brain MRI with and without contrast.   Please schedule a full eye examination with an optometrist or ophthalmologist of your choice.  Follow-up in about 3 to 4 months to see one of our nurse practitioners.

## 2022-04-09 ENCOUNTER — Telehealth: Payer: Self-pay | Admitting: Gastroenterology

## 2022-04-09 NOTE — Telephone Encounter (Signed)
Good Morning Dr. Barron Alvine,  Inbound call from patient stating that she needed to cancel her appointment an EGD and colonoscopy tomorrow  8/17 with Dr. Olean Ree at 1:30 due to starting back to school and needing time to adjust.   Patient stated she will call back at a later time to reschedule. Please advise.

## 2022-04-10 ENCOUNTER — Encounter: Payer: Medicaid Other | Admitting: Gastroenterology

## 2022-04-10 NOTE — Telephone Encounter (Signed)
Patient was scheduled for EGD/colonoscopy earlier this month and arrived without having drank any of the prep, so no procedures performed.  Now canceling EGD/colonoscopy within 24 hours.  Out of fairness to our many other patients waiting for appointments and procedures, any further no-show or last-minute cancellations will result in release from care in this office.

## 2022-04-11 ENCOUNTER — Ambulatory Visit
Admission: RE | Admit: 2022-04-11 | Discharge: 2022-04-11 | Disposition: A | Discharge status: Home / Self Care | Payer: No Typology Code available for payment source | Source: Ambulatory Visit | Attending: Neurology | Admitting: Neurology

## 2022-04-11 DIAGNOSIS — G43009 Migraine without aura, not intractable, without status migrainosus: Secondary | ICD-10-CM

## 2022-04-11 DIAGNOSIS — Z9189 Other specified personal risk factors, not elsewhere classified: Secondary | ICD-10-CM

## 2022-04-11 MED ORDER — GADOBENATE DIMEGLUMINE 529 MG/ML IV SOLN
15.0000 mL | Freq: Once | INTRAVENOUS | Status: AC | PRN
  Administered 2022-04-11: 15 mL via INTRAVENOUS

## 2022-04-16 ENCOUNTER — Telehealth: Payer: Self-pay | Admitting: *Deleted

## 2022-04-16 NOTE — Telephone Encounter (Signed)
-----   Message from Huston Foley, MD sent at 04/14/2022  8:15 AM EDT ----- Please call patient and advise her that her recent brain MRI with and without contrast was reported as normal.  She can follow-up as scheduled.

## 2022-04-16 NOTE — Telephone Encounter (Signed)
Called pt and LVM (ok per DPR) advising MRI brain w and wo contrast is normal, no concerns. Results available to view in mychart. Follow-up as scheduled with Dr Frances Furbish on 07/31/22 at 1:15 PM. Left office number in message if she has any questions.

## 2022-04-29 ENCOUNTER — Telehealth: Payer: Self-pay | Admitting: Neurology

## 2022-04-29 NOTE — Telephone Encounter (Signed)
HST- patient is self pay. She is aware that she will get the 56% discount. I informed her once she gets the bill in the mail to contact billing to set up a payment plan.

## 2022-05-27 ENCOUNTER — Ambulatory Visit: Payer: Medicaid Other | Admitting: Neurology

## 2022-05-27 DIAGNOSIS — G43009 Migraine without aura, not intractable, without status migrainosus: Secondary | ICD-10-CM

## 2022-05-27 DIAGNOSIS — G4733 Obstructive sleep apnea (adult) (pediatric): Secondary | ICD-10-CM | POA: Diagnosis not present

## 2022-05-27 DIAGNOSIS — Z9189 Other specified personal risk factors, not elsewhere classified: Secondary | ICD-10-CM

## 2022-05-29 NOTE — Progress Notes (Signed)
See procedure note.

## 2022-06-02 NOTE — Procedures (Signed)
   [facility]  HOME SLEEP TEST (Watch PAT) REPORT  STUDY DATE: 05/27/2022  DOB: 10-20-1990  MRN: [id number]  ORDERING CLINICIAN: Star Age, MD, PhD   REFERRING CLINICIAN: Pcp, No   CLINICAL INFORMATION/HISTORY: 31 year old female with a history of recurrent headaches who presents for evaluation for concern for sleep apnea.  Epworth sleepiness score: 9/24.  BMI: 30 kg/m  FINDINGS:   Sleep Summary:   Total Recording Time (hours, min): 7 hours, 49 min  Total Sleep Time (hours, min):  7 hours, 14 min  Percent REM (%):    27.4%   Respiratory Indices:   Calculated pAHI (per hour):  5.5/hour         REM pAHI:    8.5/hour       NREM pAHI: 4.4/hour  Central pAHI: 0.2/hour  Oxygen Saturation Statistics:    Oxygen Saturation (%) Mean: 95%   Minimum oxygen saturation (%):                 87%   O2 Saturation Range (%): 87-99%    O2 Saturation (minutes) <=88%: 0.1 min  Pulse Rate Statistics:   Pulse Mean (bpm):    68/min    Pulse Range (48-104/min)   IMPRESSION: OSA (obstructive sleep apnea), mild  RECOMMENDATION:  This HST shows borderline obstructive sleep apnea, with an AHI of 5.5/hour and O2 nadir of 87%.  Mild intermittent snoring was detected; of note, the snore and position sensor may have dislodged in the last quarter of the test.  Treatment with positive airway pressure can be considered with autoPAP, if desired by patient. Treatment options otherwise may include weight loss and avoidance of the supine sleep position or a dental device. These different avenues will be discussed with the patient. The patient will be seen in follow up in sleep clinic, as necessary. Please note, that other causes of the patient's symptoms, including circadian rhythm disturbances, an underlying mood disorder, medication effect and/or an underlying medical problem cannot be ruled out based on this test. Clinical correlation is recommended. The patient should be  cautioned not to drive, work at heights, or operate dangerous or heavy equipment when tired or sleepy. Review and reiteration of good sleep hygiene measures should be pursued with any patient. The referring provider will be notified of the test results.   I certify that I have reviewed the raw data recording prior to the issuance of this report in accordance with the standards of the American Academy of Sleep Medicine (AASM).  INTERPRETING PHYSICIAN:   Star Age, MD, PhD  Board Certified in Neurology and Sleep Medicine  [facility] [address] [place], [place] [address] [phone]

## 2022-06-04 ENCOUNTER — Telehealth: Payer: Self-pay | Admitting: *Deleted

## 2022-06-04 DIAGNOSIS — G4733 Obstructive sleep apnea (adult) (pediatric): Secondary | ICD-10-CM

## 2022-06-04 NOTE — Telephone Encounter (Signed)
I called pt. I advised pt that Dr. 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 Dr. Rexene Alberts recommends that pt start autopap to see if makes her feel better. I reviewed PAP compliance expectations with the pt. Pt is agreeable to starting an auto-PAP. I advised pt that an order will be sent to a DME, [facility], and they will call the pt within about one week.   They will show the pt how to use the machine, fit for masks, and troubleshoot the auto-PAP if needed. A follow up appt will be made  with [place] on 30-90 days after starting use of machine. Pt verbalized understanding to arrive 15 minutes early and bring their auto-PAP.  Pt had no questions at this time but was encouraged to call back if questions arise. I have sent the order to [facility] and have received confirmation that they have received the order. Pt does not have insurance.  She will call us back if decides to get machine.  Did relay other alternatives for her as well, weight loss, not sleeping on back and dental device.  She verbalized understanding.

## 2022-06-04 NOTE — Addendum Note (Signed)
Addended by: Star Age on: 06/04/2022 04:45 PM   Modules accepted: Orders

## 2022-06-04 NOTE — Telephone Encounter (Signed)
AutoPap order placed. 

## 2022-06-04 NOTE — Telephone Encounter (Signed)
-----   Message from Star Age, MD sent at 06/02/2022  5:52 PM EDT ----- Patient referred by ER for migraine headaches.  We had talked about home sleep testing at our previous visit.  She had an HST on 05/27/2022. Please call and notify the patient that the recent home sleep test showed obstructive sleep apnea. OSA is overall mild or borderline, but worth treating to see if she feels better after treatment.  We can send an order for a new AutoPap machine to a DME company of her choosing.  If she would rather not do AutoPap treatment, we can also forego treatment with the machine and she can work on weight loss and avoiding sleeping on her back.  Alternatively, she can be evaluated for dental device which is treatment through dentistry for the milder sleep apnea.  We can make a referral to a dentist for this.  Please let me know how she would like to proceed.   Star Age, MD, PhD [facility])

## 2022-06-05 NOTE — Telephone Encounter (Signed)
Order faxed to [facility] with fax confirmation received.

## 2022-07-31 ENCOUNTER — Ambulatory Visit (INDEPENDENT_AMBULATORY_CARE_PROVIDER_SITE_OTHER): Payer: Self-pay | Admitting: Neurology

## 2022-07-31 ENCOUNTER — Encounter: Payer: Self-pay | Admitting: Neurology

## 2022-07-31 VITALS — BP 106/66 | HR 72 | Ht 61.0 in | Wt 161.0 lb

## 2022-07-31 DIAGNOSIS — G4733 Obstructive sleep apnea (adult) (pediatric): Secondary | ICD-10-CM

## 2022-07-31 DIAGNOSIS — H93A1 Pulsatile tinnitus, right ear: Secondary | ICD-10-CM

## 2022-07-31 DIAGNOSIS — G43009 Migraine without aura, not intractable, without status migrainosus: Secondary | ICD-10-CM

## 2022-07-31 MED ORDER — RIZATRIPTAN BENZOATE 10 MG PO TBDP: 10.0000 mg | ORAL_TABLET | ORAL | 3 refills | Status: DC | PRN

## 2022-07-31 NOTE — Progress Notes (Signed)
Subjective:    Patient ID: Cristina Burke is a 31 y.o. female.  HPI    Interim history:   Cristina Burke is a 31 year old right-handed woman with an underlying benign medical history, who presents for follow-up consultation of her recurrent headaches.  The patient is unaccompanied today.  She had interim sleep testing, but has not been set up on autoPAP. I last saw her on 03/27/22, at which time she was agreeable to pursuing sleep testing and MRI brain.   She had a brain MRI with and without contrast on 04/11/2022 and I reviewed the results:    IMPRESSION: This is a normal MRI of the brain with and without contrast.   She was notified of the test results.  She had a home sleep test on 05/27/2022 which indicated borderline obstructive sleep apnea, with an AHI of 5.5/hour and O2 nadir of 87%.  She was offered AutoPap therapy to see if her headaches would improve but did not pursue treatment with AutoPap thus far.   Today, 07/31/2022: She reports feeling stable for the most part.  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  Her children at 6, 10 and 15.  She tries to get enough rest, but does endorse that sometimes her sleep fluctuates.  She would like to get started on AutoPap therapy, she is even interested in purchasing machine out-of-pocket, she is going to get in touch with the DME company again, she is also willing to try a loaner machine if that is a possibility before buying a machine out-of-pocket as she does not have insurance.  For the past 2 months she has had some pulsatile tinnitus on the right side, no muffled hearing, no ear pain, no other symptoms, admits that she has not had her eye exam yet.  Maxalt still works okay.  She had to take some 2 days ago when she had a migraine and still feels some residual effect from the headache.  She is a little bit sensitive to light.   The patient's allergies, current medications,  family history, past medical history, past social history, past surgical history and problem list were reviewed and updated as appropriate.    Previously:    I first met her at the request of the ER on 05/16/2021, at which time she reported a several year history of recurrent migrainous headaches.  We talked about preventative and acute migraine management.  She was advised to start Maxalt as needed.  She was encouraged to seek an updated eye examination.  She was advised to proceed with sleep evaluation with a sleep study and a brain MRI.  She did not pursue the tests.     05/16/21: (She) reports recurrent headaches for the past several years.  She has not been on any prescription medication for her headaches.  She had talked to her primary care provider about her headaches and was advised to use over-the-counter medications.  She uses Excedrin and has tried nausea medicine but does not recall the name of it.  She typically has a left-sided headache, it is throbbing and associated with nausea but no vomiting, often light sensitivity.  She has not had a formal eye examination since high school.  She does not have any prescription eyeglasses, no visual symptoms typically including no blurry vision or double vision or loss of vision, no visual aura.  She does have light sensitivity.  She estimates that she gets about 2-3 headaches  in a week.  She has had worsening in the past year.  She has not had any sudden onset of one-sided weakness or numbness or tingling or droopy face or slurring of speech.  She has a family history of migraines affecting her mom.  She sleeps fairly well but has woken up up to 3 times a week with a headache which is typically in the right frontal and periorbital area.  She does not typically take anything first thing in the morning to help her headache.  She has occasional nocturia, not nightly.  She is not sure if she snores.  She lives with her boyfriend and her 3 children but she does  not sleep in the same bedroom with her boyfriend.  She is a non-smoker and does not drink any alcohol.  She drinks caffeine in limitation, usually 1 soda per day.  She tries to hydrate well with water.  She works as a Estate agent.   She presented to the emergency room at [facility] on 12/16/2020 with a headache.  She also reported nausea and feeling dizzy.  She reported a several month history of recurrent headaches.  I reviewed the emergency room records.  She had a head CT without contrast on 12/17/2020 and I reviewed the results: IMPRESSION: No acute intracranial abnormalities. She did not receive any treatment in the emergency room.  She has not seen her primary care provider since then.   I reviewed a phone note from Dr. Alfonse Spruce from 10/31/2020.  She was offered preventative migraine medication but declined at the time.  She requested a CT scan to be done.  She had a head CT without contrast on 12/17/2020 and I reviewed the results:   IMPRESSION: No acute intracranial abnormalities.   Her Epworth sleepiness score is 9 out of 24, fatigue severity score is 38 out of 63.    When she saw Dr. Eileen Stanford in February 2022 she was advised to use NSAIDs as needed, ophthalmology referral was discussed at the time.    Her Past Medical History Is Significant For: Past Medical History:  Diagnosis Date   Anemia    Laryngopharyngeal reflux (LPR)    Migraine    Vertigo     Her Past Surgical History Is Significant For: Past Surgical History:  Procedure Laterality Date   TUBAL LIGATION Bilateral 07/03/2016   Procedure: POST PARTUM TUBAL LIGATION;  Surgeon: Woodroe Mode, MD;  Location: [facility];  Service: Gynecology;  Laterality: Bilateral;    Her Family History Is Significant For: Family History  Problem Relation Age of Onset   Migraines Mother    Hypertension Mother    Diabetes Mother    Diabetes Sister    Diabetes Brother     Her Social History Is Significant For: Social History    Socioeconomic History   Marital status: Single    Spouse name: Not on file   Number of children: 3   Years of education: Not on file   Highest education level: Not on file  Occupational History   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  Work: [employer].  Boyfriend/  3 kids.    Social Determinants of Health   Financial Resource Strain: Not on file  Food Insecurity: Not on file  Transportation Needs: Not on file  Physical Activity: Not on file  Stress: Not on file  Social Connections: Not on file    Her Allergies Are:  Allergies  Allergen Reactions   Feraheme [Ferumoxytol] Shortness Of Breath    Head and chest pressure  :   Her Current Medications Are:  Outpatient Encounter Medications as of 07/31/2022  Medication Sig   Ferrous Sulfate (IRON PO) Take by mouth.   rizatriptan (MAXALT-MLT) 10 MG disintegrating tablet Take 1 tablet (10 mg total) by mouth as needed for migraine. May repeat in 2 h if needed. No more than 2 pills/24 h, no more than 3 pills/week.   No facility-administered encounter medications on file as of 07/31/2022.  :  Review of Systems:  Out of a complete 14 point review of systems, all are reviewed and negative with the exception of these symptoms as listed below:  Review of Systems  Neurological:        Pt here for Migraine f/u Pt states migraines were fine until this week . Pt states migraine is not going away  Pt states in right ear she hears a pulse .    ESS:4     Objective:  Neurological Exam  Physical Exam Physical Examination:   Vitals:   07/31/22 1320  BP: 106/66  Pulse: 72    General Examination: The  patient is a very pleasant 31 y.o. female in no acute distress. She appears well-developed and well-nourished and well groomed.   HEENT: Normocephalic, atraumatic, pupils are equal, round and reactive to light, mild photophobia, funduscopic exam benign.  Extraocular tracking is well-preserved, face is symmetric with normal facial animation.  Hearing grossly intact, tympanic membranes clear bilaterally, ear canals clear bilaterally.  No lip, neck or jaw tremor, no carotid bruits. Oropharynx exam reveals: mild mouth dryness, good dental hygiene and moderate airway crowding, due to tonsillar size of about 2+, slightly wider uvula which is bicuspid.  Tongue protrudes centrally and palate elevates symmetrically.   Chest: Clear to auscultation without wheezing, rhonchi or crackles noted.   Heart: S1+S2+0, regular and normal without murmurs, rubs or gallops noted.    Abdomen: Soft, non-tender and non-distended.   Extremities: There is no pitting edema in the distal lower extremities bilaterally.    Skin: Warm and dry without trophic changes noted.    Musculoskeletal: exam reveals no obvious joint deformities.    Neurologically:  Mental status: The patient is awake, alert and oriented in all 4 spheres. Her immediate and remote memory, attention, language skills and fund of knowledge are appropriate. There is no evidence of aphasia, agnosia, apraxia or anomia. Speech is clear with normal prosody and enunciation. Thought process is linear. Mood is normal and affect is normal.  Cranial nerves II - XII are as described above under HEENT exam.  Motor exam: Normal bulk, strength and tone is noted. There is no tremor, drift or rebound.  Fine motor skills and coordination good in the upper and lower extremities with good hand movements and foot movements.  Cerebellar testing shows no dysmetria or intention tremor, normal finger-to-nose and normal heel-to-shin noted.  Sensory exam is intact to light touch.  Gait,  station and balance: She stands without difficulty, posture is normal, she walks without problems.   Assessment and Plan:    In  summary, Ahmaya Ostermiller is a very pleasant 31 year old female with a history of borderline obesity, and borderline obstructive sleep apnea who presents for follow-up consultation of her migraine headaches.  She has had fairly good success with as needed use of Maxalt 10 mg strength.  She had a brain MRI with and without contrast in August 2023, which was benign.  Sleep testing with a home sleep test on 05/27/2022 showed borderline sleep apnea and she has not started AutoPap therapy but would like to start treatment.  We mutually agreed to see if she could get a loaner machine from the [facility] first before purchasing a machine out-of-pocket.  I would like for her to try AutoPap therapy for about 3 months first with a loaner machine if possible as she does not have insurance.  Her exam is stable.  She is reminded to get a formal dilated eye examination done.  She has had some pulsatile tinnitus on the right side, exam to me looks fine, funduscopic exam also fine, she has an appointment with ENT in January.  In the meantime, she is reminded to get a formal dilated eye exam done.  She is advised to get started on AutoPap therapy and follow-up in this clinic in 3 months for her first AutoPap follow-up and also migraine follow-up, we mutually agreed to have her continue with as needed Maxalt.  I renewed the prescription.   We talked about typical migraine triggers again today.  I answered all her questions today and she was in agreement.  I spent 30 minutes in total face-to-face time and in reviewing records during pre-charting, more than 50% of which was spent in counseling and coordination of care, reviewing test results, reviewing medications and treatment regimen and/or in discussing or reviewing the diagnosis of migraine, OSA, the prognosis and treatment options. Pertinent  laboratory and imaging test results that were available during this visit with the patient were reviewed by me and considered in my medical decision making (see chart for details).

## 2022-10-30 ENCOUNTER — Ambulatory Visit: Payer: Self-pay | Admitting: Neurology

## 2022-11-25 ENCOUNTER — Telehealth: Payer: Self-pay | Admitting: *Deleted

## 2022-11-25 NOTE — Telephone Encounter (Signed)
In preparation for appt tomorrow, I called [facility] to f/u on the autopap referral. I was told the patient had a setup appt scheduled for 07/01/22 as self pay but she no showed the appt. [facility] attempted to reach the patient to reschedule but she did not return their call.   Dr Rexene Alberts has been updated.

## 2022-11-26 ENCOUNTER — Ambulatory Visit (INDEPENDENT_AMBULATORY_CARE_PROVIDER_SITE_OTHER): Payer: Self-pay | Admitting: Neurology

## 2022-11-26 ENCOUNTER — Encounter: Payer: Self-pay | Admitting: Neurology

## 2022-11-26 VITALS — BP 98/72 | HR 73 | Ht 61.0 in | Wt 157.8 lb

## 2022-11-26 DIAGNOSIS — G43009 Migraine without aura, not intractable, without status migrainosus: Secondary | ICD-10-CM

## 2022-11-26 DIAGNOSIS — G44209 Tension-type headache, unspecified, not intractable: Secondary | ICD-10-CM

## 2022-11-26 DIAGNOSIS — M542 Cervicalgia: Secondary | ICD-10-CM

## 2022-11-26 DIAGNOSIS — G4733 Obstructive sleep apnea (adult) (pediatric): Secondary | ICD-10-CM

## 2022-11-26 NOTE — Progress Notes (Signed)
Subjective:    Patient ID: Cristina Burke is a 32 y.o. female.  HPI    Interim history:   Cristina Burke is a 32 year old right-handed woman with an underlying benign medical history, who presents for follow-up consultation of her recurrent headaches.  The patient is unaccompanied today.  She had interim sleep testing, but has not been set up on autoPAP. I last saw her on 07/31/2022, at which time she reported feeling stable for the most part.  She did endorse stress.  She was interested in starging AutoPap therapy. She reported a 2 month history of a pulsatile tinnitus on the right side, no muffled hearing, no ear pain, no other symptoms. She was advised to start home autoPAP therapy. She was advised to get a full, dilated eye exam. She was advised to continue with Maxalt as needed for migraine headaches and keep her appointment with ENT which was scheduled.  Today, 11/26/22: She reports overall doing well and is feeling stable with regards to her migraines.  She has an upcoming surgery appointment later this month to get a right supraclavicular mass removed.  She reports intermittent neck pain and tenderness in the back of her head and radiating pain upwards, does endorse some more stress lately.  She has not had an eye exam yet.  She has decided to postpone the AutoPap treatment until after her surgery.  She does not need a refill on Maxalt.  She has had no other new symptoms.  The patient's allergies, current medications, family history, past medical history, past social history, past surgical history and problem list were reviewed and updated as appropriate.    Previously:    I saw her on 03/27/22, at which time she was agreeable to pursuing sleep testing and MRI brain.    She had a brain MRI with and without contrast on 04/11/2022 and I reviewed the results:     IMPRESSION: This is a normal MRI of the brain with and without contrast.   She was notified of the test results.   She had a home  sleep test on 05/27/2022 which indicated borderline obstructive sleep apnea, with an AHI of 5.5/hour and O2 nadir of 87%.  She was offered AutoPap therapy to see if her headaches would improve but did not pursue treatment with AutoPap thus far.        I first met her at the request of the ER on 05/16/2021, at which time she reported a several year history of recurrent migrainous headaches.  We talked about preventative and acute migraine management.  She was advised to start Maxalt as needed.  She was encouraged to seek an updated eye examination.  She was advised to proceed with sleep evaluation with a sleep study and a brain MRI.  She did not pursue the tests.     05/16/21: (She) reports recurrent headaches for the past several years.  She has not been on any prescription medication for her headaches.  She had talked to her primary care provider about her headaches and was advised to use over-the-counter medications.  She uses Excedrin and has tried nausea medicine but does not recall the name of it.  She typically has a left-sided headache, it is throbbing and associated with nausea but no vomiting, often light sensitivity.  She has not had a formal eye examination since high school.  She does not have any prescription eyeglasses, no visual symptoms typically including no blurry vision or double vision or loss of vision, no  visual aura.  She does have light sensitivity.  She estimates that she gets about 2-3 headaches in a week.  She has had worsening in the past year.  She has not had any sudden onset of one-sided weakness or numbness or tingling or droopy face or slurring of speech.  She has a family history of migraines affecting her mom.  She sleeps fairly well but has woken up up to 3 times a week with a headache which is typically in the right frontal and periorbital area.  She does not typically take anything first thing in the morning to help her headache.  She has occasional nocturia, not nightly.   She is not sure if she snores.  She lives with her boyfriend and her 3 children but she does not sleep in the same bedroom with her boyfriend.  She is a non-smoker and does not drink any alcohol.  She drinks caffeine in limitation, usually 1 soda per day.  She tries to hydrate well with water.  She works as a Estate agent.   She presented to the emergency room at [facility] on 12/16/2020 with a headache.  She also reported nausea and feeling dizzy.  She reported a several month history of recurrent headaches.  I reviewed the emergency room records.  She had a head CT without contrast on 12/17/2020 and I reviewed the results: IMPRESSION: No acute intracranial abnormalities. She did not receive any treatment in the emergency room.  She has not seen her primary care provider since then.   I reviewed a phone note from Dr. Alfonse Spruce from 10/31/2020.  She was offered preventative migraine medication but declined at the time.  She requested a CT scan to be done.  She had a head CT without contrast on 12/17/2020 and I reviewed the results:   IMPRESSION: No acute intracranial abnormalities.   Her Epworth sleepiness score is 9 out of 24, fatigue severity score is 38 out of 63.    When she saw Dr. Eileen Stanford in February 2022 she was advised to use NSAIDs as needed, ophthalmology referral was discussed at the time.         Her Past Medical History Is Significant For: Past Medical History:  Diagnosis Date   Anemia    Laryngopharyngeal reflux (LPR)    Migraine    Vertigo     Her Past Surgical History Is Significant For: Past Surgical History:  Procedure Laterality Date   TUBAL LIGATION Bilateral 07/03/2016   Procedure: POST PARTUM TUBAL LIGATION;  Surgeon: Woodroe Mode, MD;  Location: [facility];  Service: Gynecology;  Laterality: Bilateral;    Her Family History Is Significant For: Family History  Problem Relation Age of Onset   Migraines Mother    Hypertension Mother    Diabetes Mother    Diabetes  Sister    Diabetes Brother     Her Social History Is Significant For: Social History   Socioeconomic History   Marital status: Single    Spouse name: Not on file   Number of children: 3   Years of education: Not on file   Highest education level: Not on file  Occupational History   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  Work: [employer].  Boyfriend/  3 kids.    Social Determinants of Health   Financial Resource Strain: Not on file  Food Insecurity: Not on file  Transportation Needs: Not on file  Physical Activity: Not on file  Stress: Not on file  Social Connections: Not on file    Her Allergies Are:  Allergies  Allergen Reactions   Feraheme [Ferumoxytol] Shortness Of Breath    Head and chest pressure  :   Her Current Medications Are:  Outpatient Encounter Medications as of 11/26/2022  Medication Sig   Ferrous Sulfate (IRON PO) Take by mouth.   rizatriptan (MAXALT-MLT) 10 MG disintegrating tablet Take 1 tablet (10 mg total) by mouth as needed for migraine. May repeat in 2 h if needed. No more than 2 pills/24 h, no more than 3 pills/week.   No facility-administered encounter medications on file as of 11/26/2022.  :  Review of Systems:  Out of a complete 14 point review of systems, all are reviewed and negative with the exception of these symptoms as listed below:  Review of Systems  Neurological:        Here for migraine follow up, she is doing well with maxalt.  Her cpap, she did not receive call. She wants to wait on that as she has surgery 12-17-2022 coming up.      Objective:  Neurological Exam  Physical Exam Physical Examination:    Vitals:   11/26/22 0959 11/26/22 1020  BP: 97/60 98/72  Pulse: 73     General Examination: The patient is a very pleasant 32 y.o. female in no acute distress. She appears well-developed and well-nourished and well groomed.   HEENT: Normocephalic, atraumatic, pupils are equal, round and reactive to light, mild photophobia, funduscopic exam benign.  Extraocular tracking is well-preserved, face is symmetric with normal facial animation.  Hearing grossly intact, tympanic membranes clear bilaterally, ear canals clear bilaterally.  No lip, neck or jaw tremor, no carotid bruits. Oropharynx exam reveals: mild mouth dryness, good dental hygiene and moderate airway crowding, due to tonsillar size of about 2+, slightly wider uvula which is bicuspid.  Tongue protrudes centrally and palate elevates symmetrically.  Soft tissue mass right supraclavicular area, not tender.   Chest: Clear to auscultation without wheezing, rhonchi or crackles noted.   Heart: S1+S2+0, regular and normal without murmurs, rubs or gallops noted.    Abdomen: Soft, non-tender and non-distended.   Extremities: There is no pitting edema in the distal lower extremities bilaterally.    Skin: Warm and dry without trophic changes noted.    Musculoskeletal: exam reveals no obvious joint deformities.    Neurologically:  Mental status: The patient is awake, alert and oriented in all 4 spheres. Her immediate and remote memory, attention, language skills and fund of knowledge are appropriate. There is no evidence of aphasia, agnosia, apraxia or anomia. Speech is clear with normal prosody and enunciation. Thought process is linear. Mood is normal and affect is normal.  Cranial nerves II - XII are as described above under HEENT exam.  Motor exam: Normal bulk, strength and tone is noted. There is no tremor, drift or rebound.  Fine motor skills and coordination good in the upper and lower extremities with good hand movements and foot  movements.  Cerebellar testing shows no dysmetria or intention tremor, normal finger-to-nose and normal heel-to-shin noted.  Sensory exam  is intact to light touch.  Gait, station and balance: She stands without difficulty, posture is normal, she walks without problems.   Assessment and Plan:    In summary, Camryn Risse is a very pleasant 32 year old female with a history of borderline obesity, and borderline obstructive sleep apnea who presents for follow-up consultation of her migraine headaches.  She has had fairly good success with as needed use of Maxalt 10 mg strength.  She had a brain MRI with and without contrast in August 2023, which was benign.  She has had interim increase in stress and may also have some cervicogenic and tension headaches at this point.  She is advised to use local heat and as needed Tylenol or ibuprofen for these.  Sleep testing with a home sleep test on 05/27/2022 showed borderline sleep apnea and she would like to consider AutoPap therapy after she has had her surgery for removal of a supraclavicular mass on the right.  She is scheduled to have surgery at the end of this month.  She is reminded to make an appointment for a formal eye exam.  We have previously talked about this in detail.  She is advised to follow-up in this clinic routinely in 1 year to see one of our nurse practitioners.  If she ends up starting AutoPap therapy at home, we will make a follow-up appointment for a sleep apnea recheck within 1 to 3 months of treatment started at home, we talked about the importance of compliance with PAP therapy as well today.  I answered all her questions today and she was in agreement.   I spent 30 minutes in total face-to-face time and in reviewing records during pre-charting, more than 50% of which was spent in counseling and coordination of care, reviewing test results, reviewing medications and treatment regimen and/or in discussing or reviewing the diagnosis of migraine  headaches, tension headaches, mild obstructive sleep apnea, the prognosis and treatment options. Pertinent laboratory and imaging test results that were available during this visit with the patient were reviewed by me and considered in my medical decision making (see chart for details).

## 2022-11-26 NOTE — Patient Instructions (Addendum)
You can continue with as needed use of Maxalt for your migraine headaches.  Good luck with your upcoming surgery this month.  If you decide to pursue treatment for your sleep apnea with an AutoPap machine, please contact the DME provider, [facility].  Once you have a machine you will need to make a follow-up appointment within 1 to 3 months of starting treatment at home.  For now, please schedule a follow-up appointment to see one of our nurse practitioners for your migraine checkup in 1 year. Please schedule an eye exam too.

## 2023-11-26 ENCOUNTER — Ambulatory Visit: Payer: Medicaid Other | Admitting: Family Medicine

## 2023-11-26 ENCOUNTER — Encounter: Payer: Self-pay | Admitting: Family Medicine

## 2023-11-26 VITALS — BP 102/66 | HR 72 | Ht 61.0 in | Wt 157.0 lb

## 2023-11-26 DIAGNOSIS — G4733 Obstructive sleep apnea (adult) (pediatric): Secondary | ICD-10-CM

## 2023-11-26 DIAGNOSIS — G43009 Migraine without aura, not intractable, without status migrainosus: Secondary | ICD-10-CM | POA: Diagnosis not present

## 2023-11-26 MED ORDER — RIZATRIPTAN BENZOATE 10 MG PO TBDP: 10.0000 mg | ORAL_TABLET | ORAL | 3 refills | Status: AC | PRN

## 2023-11-26 NOTE — Patient Instructions (Signed)
 Below is our plan:  We will continue rizatriptan as needed. Try adding Aleve 1 tablet daily starting 3-5 days before menstrual cycle.   Please make sure you are staying well hydrated. I recommend 50-60 ounces daily. Well balanced diet and regular exercise encouraged. Consistent sleep schedule with 6-8 hours recommended.   Please continue follow up with care team as directed.   Follow up with me in 1 year   You may receive a survey regarding today's visit. I encourage you to leave honest feed back as I do use this information to improve patient care. Thank you for seeing me today!   GENERAL HEADACHE INFORMATION:   Natural supplements: Magnesium Oxide or Magnesium Glycinate 500 mg at bed (up to 800 mg daily) Coenzyme Q10 300 mg in AM Vitamin B2- 200 mg twice a day   Add 1 supplement at a time since even natural supplements can have undesirable side effects. You can sometimes buy supplements cheaper (especially Coenzyme Q10) at www.WebmailGuide.co.za or at [facility].  Migraine with aura: There is increased risk for stroke in women with migraine with aura and a contraindication for the combined contraceptive pill for use by women who have migraine with aura. The risk for women with migraine without aura is lower. However other risk factors like smoking are far more likely to increase stroke risk than migraine. There is a recommendation for no smoking and for the use of OCPs without estrogen such as progestogen only pills particularly for women with migraine with aura.Marland Kitchen People who have migraine headaches with auras may be 3 times more likely to have a stroke caused by a blood clot, compared to migraine patients who don't see auras. Women who take hormone-replacement therapy may be 30 percent more likely to suffer a clot-based stroke than women not taking medication containing estrogen. Other risk factors like smoking and high blood pressure may be  much more important.    Vitamins and herbs that show  potential:   Magnesium: Magnesium (250 mg twice a day or 500 mg at bed) has a relaxant effect on smooth muscles such as blood vessels. Individuals suffering from frequent or daily headache usually have low magnesium levels which can be increase with daily supplementation of 400-750 mg. Three trials found 40-90% average headache reduction  when used as a preventative. Magnesium may help with headaches are aura, the best evidence for magnesium is for migraine with aura is its thought to stop the cortical spreading depression we believe is the pathophysiology of migraine aura.Magnesium also demonstrated the benefit in menstrually related migraine.  Magnesium is part of the messenger system in the serotonin cascade and it is a good muscle relaxant.  It is also useful for constipation which can be a side effect of other medications used to treat migraine. Good sources include nuts, whole grains, and tomatoes. Side Effects: loose stool/diarrhea  Riboflavin (vitamin B 2) 200 mg twice a day. This vitamin assists nerve cells in the production of ATP a principal energy storing molecule.  It is necessary for many chemical reactions in the body.  There have been at least 3 clinical trials of riboflavin using 400 mg per day all of which suggested that migraine frequency can be decreased.  All 3 trials showed significant improvement in over half of migraine sufferers.  The supplement is found in bread, cereal, milk, meat, and poultry.  Most Americans get more riboflavin than the recommended daily allowance, however riboflavin deficiency is not necessary for the supplements to help prevent  headache. Side effects: energizing, green urine   Coenzyme Q10: This is present in almost all cells in the body and is critical component for the conversion of energy.  Recent studies have shown that a nutritional supplement of CoQ10 can reduce the frequency of migraine attacks by improving the energy production of cells as with  riboflavin.  Doses of 150 mg twice a day have been shown to be effective.   Melatonin: Increasing evidence shows correlation between melatonin secretion and headache conditions.  Melatonin supplementation has decreased headache intensity and duration.  It is widely used as a sleep aid.  Sleep is natures way of dealing with migraine.  A dose of 3 mg is recommended to start for headaches including cluster headache. Higher doses up to 15 mg has been reviewed for use in Cluster headache and have been used. The rationale behind using melatonin for cluster is that many theories regarding the cause of Cluster headache center around the disruption of the normal circadian rhythm in the brain.  This helps restore the normal circadian rhythm.   HEADACHE DIET: Foods and beverages which may trigger migraine Note that only 20% of headache patients are food sensitive. You will know if you are food sensitive if you get a headache consistently 20 minutes to 2 hours after eating a certain food. Only cut out a food if it causes headaches, otherwise you might remove foods you enjoy! What matters most for diet is to eat a well balanced healthy diet full of vegetables and low fat protein, and to not miss meals.   Chocolate, other sweets ALL cheeses except cottage and cream cheese Dairy products, yogurt, sour cream, ice cream Liver Meat extracts (Bovril, Marmite, meat tenderizers) Meats or fish which have undergone aging, fermenting, pickling or smoking. These include: Hotdogs,salami,Lox,sausage, mortadellas,smoked salmon, pepperoni, Pickled herring Pods of broad bean (English beans, Chinese pea pods, [place] (fava) beans, lima and navy beans Ripe avocado, ripe banana Yeast extracts or active yeast preparations such as Brewer's or Fleishman's (commercial bakes goods are permitted) Tomato based foods, pizza (lasagna, etc.)   MSG (monosodium glutamate) is disguised as many things; look for these common  aliases: Monopotassium glutamate Autolysed yeast Hydrolysed protein Sodium caseinate "flavorings" "all natural preservatives" Nutrasweet   Avoid all other foods that convincingly provoke headaches.   Resources: The Dizzy [place]air Laundry Your Headache Diet, migrainestrong.com  https://zamora-andrews.com/   Caffeine and Migraine For patients that have migraine, caffeine intake more than 3 days per week can lead to dependency and increased migraine frequency. I would recommend cutting back on your caffeine intake as best you can. The recommended amount of caffeine is 200-300 mg daily, although migraine patients may experience dependency at even lower doses. While you may notice an increase in headache temporarily, cutting back will be helpful for headaches in the long run. For more information on caffeine and migraine, visit: https://americanmigrainefoundation.org/resource-library/caffeine-and-migraine/   Headache Prevention Strategies:   1. Maintain a headache diary; learn to identify and avoid triggers.  - This can be a simple note where you log when you had a headache, associated symptoms, and medications used - There are several smartphone apps developed to help track migraines: Migraine Buddy, Migraine Monitor, Curelator N1-Headache App   Common triggers include: Emotional triggers: Emotional/Upset family or friends Emotional/Upset occupation Business reversal/success Anticipation anxiety Crisis-serious Post-crisis periodNew job/position   Physical triggers: Vacation Day Weekend Strenuous Exercise High Altitude Location New Move Menstrual Day Physical Illness Oversleep/Not enough sleep Weather changes Light: Photophobia or light sesnitivity  treatment involves a balance between desensitization and reduction in overly strong input. Use dark polarized glasses outside, but not inside. Avoid bright or fluorescent light, but do not dim  environment to the point that going into a normally lit room hurts. Consider FL-41 tint lenses, which reduce the most irritating wavelengths without blocking too much light.  These can be obtained at axonoptics.com or theraspecs.com Foods: see list above.   2. Limit use of acute treatments (over-the-counter medications, triptans, etc.) to no more than 2 days per week or 10 days per month to prevent medication overuse headache (rebound headache).     3. Follow a regular schedule (including weekends and holidays): Don't skip meals. Eat a balanced diet. 8 hours of sleep nightly. Minimize stress. Exercise 30 minutes per day. Being overweight is associated with a 5 times increased risk of chronic migraine. Keep well hydrated and drink 6-8 glasses of water per day.   4. Initiate non-pharmacologic measures at the earliest onset of your headache. Rest and quiet environment. Relax and reduce stress. Breathe2Relax is a free app that can instruct you on    some simple relaxtion and breathing techniques. Http://Dawnbuse.com is a    free website that provides teaching videos on relaxation.  Also, there are  many apps that   can be downloaded for "mindful" relaxation.  An app called YOGA NIDRA will help walk you through mindfulness. Another app called Calm can be downloaded to give you a structured mindfulness guide with daily reminders and skill development. Headspace for guided meditation Mindfulness Based Stress Reduction Online Course: www.palousemindfulness.com Cold compresses.   5. Don't wait!! Take the maximum allowable dosage of prescribed medication at the first sign of migraine.   6. Compliance:  Take prescribed medication regularly as directed and at the first sign of a migraine.   7. Communicate:  Call your physician when problems arise, especially if your headaches change, increase in frequency/severity, or become associated with neurological symptoms (weakness, numbness, slurred speech, etc.).  Proceed to emergency room if you experience new or worsening symptoms or symptoms do not resolve, if you have new neurologic symptoms or if headache is severe, or for any concerning symptom.   8. Headache/pain management therapies: Consider various complementary methods, including medication, behavioral therapy, psychological counselling, biofeedback, massage therapy, acupuncture, dry needling, and other modalities.  Such measures may reduce the need for medications. Counseling for pain management, where patients learn to function and ignore/minimize their pain, seems to work very well.   9. Recommend changing family's attention and focus away from patient's headaches. Instead, emphasize daily activities. If first question of day is 'How are your headaches/Do you have a headache today?', then patient will constantly think about headaches, thus making them worse. Goal is to re-direct attention away from headaches, toward daily activities and other distractions.   10. Helpful Websites: www.AmericanHeadacheSociety.org PatentHood.ch www.headaches.org TightMarket.nl www.achenet.org

## 2023-11-26 NOTE — Progress Notes (Signed)
 Chief Complaint  Patient presents with   Follow-up     Pt in 1 alone Pt here for Migraines Pt states some fatigue Pt states throbbing pain in back of head Pt states was looking at phone and she had a stuck feeling Pt states April 2024 had mass removed from neck     HISTORY OF PRESENT ILLNESS:  11/26/23 ALL:  Cristina Burke is a 33 y.o. female here today for follow up for migraines. She was last seen by Dr Frances Furbish 11/2022 and continued rizatriptan as needed. She was considering autoPAP for borderline OSA but wanted to wait until after surgery. Since, she reports headaches continue. She has 3-4 headache days a month. Usually right sided. Described as throbbing pain. She has light sensitivity and nausea. No vision changes. She does not wake with headaches. She drinks about 50-60 ounces of water daily. She sleeps well.   Tubal in 2017. Has heavy menstrual cycles. Headaches worse prior to menses. Last longer and rizatriptan does not seem to work as well.   Has CPE with Earmon Phoenix, MD next week. She is scheduled for iron transfusion next week.   HISTORY (copied from Dr Teofilo Pod previous note)  Cristina Burke is a 33 year old right-handed woman with an underlying benign medical history, who presents for follow-up consultation of her recurrent headaches.  The patient is unaccompanied today.  She had interim sleep testing, but has not been set up on autoPAP. I last saw her on 07/31/2022, at which time she reported feeling stable for the most part.  She did endorse stress.  She was interested in starging AutoPap therapy. She reported a 2 month history of a pulsatile tinnitus on the right side, no muffled hearing, no ear pain, no other symptoms. She was advised to start home autoPAP therapy. She was advised to get a full, dilated eye exam. She was advised to continue with Maxalt as needed for migraine headaches and keep her appointment with ENT which was scheduled.   Today, 11/26/22: She reports overall  doing well and is feeling stable with regards to her migraines.  She has an upcoming surgery appointment later this month to get a right supraclavicular mass removed.  She reports intermittent neck pain and tenderness in the back of her head and radiating pain upwards, does endorse some more stress lately.  She has not had an eye exam yet.  She has decided to postpone the AutoPap treatment until after her surgery.  She does not need a refill on Maxalt.  She has had no other new symptoms.   REVIEW OF SYSTEMS: Out of a complete 14 system review of symptoms, the patient complains only of the following symptoms, headaches, fatigue and all other reviewed systems are negative.   ALLERGIES: Allergies  Allergen Reactions   Feraheme [Ferumoxytol] Shortness Of Breath    Head and chest pressure     HOME MEDICATIONS: Outpatient Medications Prior to Visit  Medication Sig Dispense Refill   Ferrous Sulfate (IRON PO) Take by mouth. (Patient not taking: Reported on 11/26/2023)     rizatriptan (MAXALT-MLT) 10 MG disintegrating tablet Take 1 tablet (10 mg total) by mouth as needed for migraine. May repeat in 2 h if needed. No more than 2 pills/24 h, no more than 3 pills/week. (Patient not taking: Reported on 11/26/2023) 9 tablet 3   No facility-administered medications prior to visit.     PAST MEDICAL HISTORY: Past Medical History:  Diagnosis Date   Anemia    Laryngopharyngeal  reflux (LPR)    Migraine    Vertigo      PAST SURGICAL HISTORY: Past Surgical History:  Procedure Laterality Date   TUBAL LIGATION Bilateral 07/03/2016   Procedure: POST PARTUM TUBAL LIGATION;  Surgeon: Adam Phenix, MD;  Location: [facility];  Service: Gynecology;  Laterality: Bilateral;     FAMILY HISTORY: Family History  Problem Relation Age of Onset   Migraines Mother    Hypertension Mother    Diabetes Mother    Diabetes Sister    Diabetes Brother      SOCIAL HISTORY: Social History   Socioeconomic History    Marital status: Single    Spouse name: Not on file   Number of children: 3   Years of education: Not on file   Highest education level: Not on file  Occupational History   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  Work: [employer].  Boyfriend/  3 kids.    Social Drivers of Corporate investment banker Strain: Not on file  Food Insecurity: Low Risk  (11/23/2023)   Received from [facility]   Hunger Vital Sign    Worried About Running Out of Food in the Last Year: Never true    Ran Out of Food in the Last Year: Never true  Transportation Needs: No Transportation Needs (11/23/2023)   Received from [facility]    In the past 12 months, has lack of reliable transportation kept you from medical appointments, meetings, work or from getting things needed for daily living? : No  Physical Activity: Not on file  Stress: Not on file  Social Connections: Unknown (01/06/2022)   Received from [facility]   Social Network    Social Network: Not on file  Intimate Partner Violence: Unknown (11/28/2021)   Received from [facility]   HITS    Physically Hurt: Not on file    Insult or Talk Down To: Not on file    Threaten Physical Harm: Not on file    Scream or Curse: Not on file     PHYSICAL EXAM  Vitals:   11/26/23 1007  BP: 102/66  Pulse: 72  Weight: 157 lb (71.2 kg)  Height: 5\' 1"  (1.549 m)   Body mass index is 29.66 kg/m.  Generalized: Well developed, in no acute distress  Cardiology: normal rate and rhythm, no murmur auscultated  Respiratory: clear to auscultation bilaterally    Neurological  examination  Mentation: Alert oriented to time, place, history taking. Follows all commands speech and language fluent Cranial nerve II-XII: Pupils were equal round reactive to light. Extraocular movements were full, visual field were full on confrontational test. Facial sensation and strength were normal. Uvula tongue midline. Head turning and shoulder shrug  were normal and symmetric. Motor: The motor testing reveals 5 over 5 strength of all 4 extremities. Good symmetric motor tone is noted throughout.  Gait and station: Gait is normal.    DIAGNOSTIC DATA (LABS, IMAGING, TESTING) - I reviewed patient records, labs, notes, testing and imaging myself where available.  Lab Results  Component Value Date   WBC 6.6 02/21/2022   HGB 9.9 (L) 02/21/2022   HCT 32.0 (L) 02/21/2022   MCV 75.0 (L) 02/21/2022   PLT 283.0 02/21/2022      Component Value Date/Time   NA 140 02/21/2022 1508   NA 142 05/16/2021 1115   K 3.8 02/21/2022 1508   CL 106 02/21/2022 1508   CO2 29 02/21/2022 1508   GLUCOSE 91 02/21/2022 1508   GLUCOSE 80 05/15/2016 1044   BUN 21 02/21/2022 1508   BUN 16 05/16/2021 1115   CREATININE 0.79 02/21/2022 1508   CALCIUM 9.0 02/21/2022 1508   PROT 7.6 02/21/2022 1508   PROT 7.4 05/16/2021 1115   ALBUMIN 4.1 02/21/2022 1508   ALBUMIN 4.5 05/16/2021 1115   AST 13 02/21/2022 1508   ALT 11 02/21/2022 1508   ALKPHOS 78 02/21/2022 1508   BILITOT 0.3 02/21/2022 1508   BILITOT 0.2 05/16/2021 1115   GFRNONAA >60 10/18/2019 1128   GFRAA >60 10/18/2019 1128   No results found for: "CHOL", "HDL", "LDLCALC", "LDLDIRECT", "TRIG", "CHOLHDL" Lab Results  Component Value Date   HGBA1C 5.5 08/15/2020   No results found for: "VITAMINB12" Lab Results  Component Value Date   TSH 0.774 10/16/2020        No data to display               No data to display           ASSESSMENT AND PLAN  34 y.o. year old female  has a past medical history of Anemia, Laryngopharyngeal  reflux (LPR), Migraine, and Vertigo. here with    Migraine without aura and without status migrainosus, not intractable  OSA (obstructive sleep apnea)  Doreen Beam reports headaches are well managed. She will continue rizatriptan as needed. May consider Aleve 220mg  daily starting 3-5 days prior to menses for menstrual migraines. She is not interested in CPAP at this time but will continue to monitor. Healthy lifestyle habits encouraged. She will follow up with PCP as directed. She will return to see me in 1 year, sooner if needed. She verbalizes understanding and agreement with this plan.   No orders of the defined types were placed in this encounter.    No orders of the defined types were placed in this encounter.    Shawnie Dapper, MSN, FNP-C 11/26/2023, 10:11 AM  [facility] [address] [place], [place] [address] [phone]

## 2024-11-29 ENCOUNTER — Ambulatory Visit: Admitting: Family Medicine
